# Patient Record
Sex: Female | Born: 1980 | Race: White | Hispanic: No | Marital: Married | Smoking: Current every day smoker
Health system: Southern US, Community
[De-identification: ages and names within clinical notes are randomized; demographics above are authoritative.]

## PROBLEM LIST (undated history)

## (undated) DIAGNOSIS — F32A Depression, unspecified: Secondary | ICD-10-CM

## (undated) DIAGNOSIS — J449 Chronic obstructive pulmonary disease, unspecified: Secondary | ICD-10-CM

## (undated) DIAGNOSIS — I10 Essential (primary) hypertension: Secondary | ICD-10-CM

## (undated) DIAGNOSIS — E079 Disorder of thyroid, unspecified: Secondary | ICD-10-CM

## (undated) DIAGNOSIS — I2510 Atherosclerotic heart disease of native coronary artery without angina pectoris: Secondary | ICD-10-CM

## (undated) DIAGNOSIS — F329 Major depressive disorder, single episode, unspecified: Secondary | ICD-10-CM

## (undated) DIAGNOSIS — K900 Celiac disease: Secondary | ICD-10-CM

## (undated) HISTORY — PX: ABDOMINAL HYSTERECTOMY: SHX81

---

## 2014-12-14 ENCOUNTER — Ambulatory Visit
Admission: EM | Admit: 2014-12-14 | Discharge: 2014-12-14 | Disposition: A | Discharge status: Home / Self Care | Payer: BLUE CROSS/BLUE SHIELD | Attending: Family Medicine | Admitting: Family Medicine

## 2014-12-14 ENCOUNTER — Encounter: Payer: Self-pay | Admitting: *Deleted

## 2014-12-14 ENCOUNTER — Ambulatory Visit: Payer: BLUE CROSS/BLUE SHIELD

## 2014-12-14 DIAGNOSIS — F17210 Nicotine dependence, cigarettes, uncomplicated: Secondary | ICD-10-CM | POA: Diagnosis not present

## 2014-12-14 DIAGNOSIS — S8002XA Contusion of left knee, initial encounter: Secondary | ICD-10-CM | POA: Insufficient documentation

## 2014-12-14 DIAGNOSIS — M25562 Pain in left knee: Secondary | ICD-10-CM | POA: Diagnosis present

## 2014-12-14 DIAGNOSIS — Z79899 Other long term (current) drug therapy: Secondary | ICD-10-CM | POA: Insufficient documentation

## 2014-12-14 DIAGNOSIS — W19XXXA Unspecified fall, initial encounter: Secondary | ICD-10-CM | POA: Insufficient documentation

## 2014-12-14 MED ORDER — KETOROLAC TROMETHAMINE 60 MG/2ML IM SOLN
60.0000 mg | Freq: Once | INTRAMUSCULAR | Status: AC
  Administered 2014-12-14: 60 mg via INTRAMUSCULAR

## 2014-12-14 MED ORDER — MUPIROCIN CALCIUM 2 % EX CREA: TOPICAL_CREAM | Freq: Three times a day (TID) | CUTANEOUS | Status: DC

## 2014-12-14 NOTE — Discharge Instructions (Signed)
Contusion A contusion is a deep bruise. Contusions happen when an injury causes bleeding under the skin. Signs of bruising include pain, puffiness (swelling), and discolored skin. The contusion may turn blue, purple, or yellow. HOME CARE   Put ice on the injured area.  Put ice in a plastic bag.  Place a towel between your skin and the bag.  Leave the ice on for 15-20 minutes, 03-04 times a day.  Only take medicine as told by your doctor.  Rest the injured area.  If possible, raise (elevate) the injured area to lessen puffiness. GET HELP RIGHT AWAY IF:   You have more bruising or puffiness.  You have pain that is getting worse.  Your puffiness or pain is not helped by medicine. MAKE SURE YOU:   Understand these instructions.  Will watch your condition.  Will get help right away if you are not doing well or get worse. Document Released: 10/14/2007 Document Revised: 07/20/2011 Document Reviewed: 03/02/2011 [facility] Patient Information 2015 [place], [place]. This information is not intended to replace advice given to you by your health care provider. Make sure you discuss any questions you have with your health care provider.  Contusion A contusion is a deep bruise. Contusions are the result of an injury that caused bleeding under the skin. The contusion may turn blue, purple, or yellow. Minor injuries will give you a painless contusion, but more severe contusions may stay painful and swollen for a few weeks.  CAUSES  A contusion is usually caused by a blow, trauma, or direct force to an area of the body. SYMPTOMS   Swelling and redness of the injured area.  Bruising of the injured area.  Tenderness and soreness of the injured area.  Pain. DIAGNOSIS  The diagnosis can be made by taking a history and physical exam. An X-ray, CT scan, or MRI may be needed to determine if there were any associated injuries, such as fractures. TREATMENT  Specific treatment will depend on what  area of the body was injured. In general, the best treatment for a contusion is resting, icing, elevating, and applying cold compresses to the injured area. Over-the-counter medicines may also be recommended for pain control. Ask your caregiver what the best treatment is for your contusion. HOME CARE INSTRUCTIONS   Put ice on the injured area.  Put ice in a plastic bag.  Place a towel between your skin and the bag.  Leave the ice on for 15-20 minutes, 3-4 times a day, or as directed by your health care provider.  Only take over-the-counter or prescription medicines for pain, discomfort, or fever as directed by your caregiver. Your caregiver may recommend avoiding anti-inflammatory medicines (aspirin, ibuprofen, and naproxen) for 48 hours because these medicines may increase bruising.  Rest the injured area.  If possible, elevate the injured area to reduce swelling. SEEK IMMEDIATE MEDICAL CARE IF:   You have increased bruising or swelling.  You have pain that is getting worse.  Your swelling or pain is not relieved with medicines. MAKE SURE YOU:   Understand these instructions.  Will watch your condition.  Will get help right away if you are not doing well or get worse. Document Released: 02/04/2005 Document Revised: 05/02/2013 Document Reviewed: 03/02/2011 [facility] Patient Information 2015 [place], [place]. This information is not intended to replace advice given to you by your health care provider. Make sure you discuss any questions you have with your health care provider. Abrasion An abrasion is a cut or scrape of  the skin. Abrasions do not extend through all layers of the skin and most heal within 10 days. It is important to care for your abrasion properly to prevent infection. CAUSES  Most abrasions are caused by falling on, or gliding across, the ground or other surface. When your skin rubs on something, the outer and inner layer of skin rubs off, causing an  abrasion. DIAGNOSIS  Your caregiver will be able to diagnose an abrasion during a physical exam.  TREATMENT  Your treatment depends on how large and deep the abrasion is. Generally, your abrasion will be cleaned with water and a mild soap to remove any dirt or debris. An antibiotic ointment may be put over the abrasion to prevent an infection. A bandage (dressing) may be wrapped around the abrasion to keep it from getting dirty.  You may need a tetanus shot if:  You cannot remember when you had your last tetanus shot.  You have never had a tetanus shot.  The injury broke your skin. If you get a tetanus shot, your arm may swell, get red, and feel warm to the touch. This is common and not a problem. If you need a tetanus shot and you choose not to have one, there is a rare chance of getting tetanus. Sickness from tetanus can be serious.  HOME CARE INSTRUCTIONS   If a dressing was applied, change it at least once a day or as directed by your caregiver. If the bandage sticks, soak it off with warm water.   Wash the area with water and a mild soap to remove all the ointment 2 times a day. Rinse off the soap and pat the area dry with a clean towel.   Reapply any ointment as directed by your caregiver. This will help prevent infection and keep the bandage from sticking. Use gauze over the wound and under the dressing to help keep the bandage from sticking.   Change your dressing right away if it becomes wet or dirty.   Only take over-the-counter or prescription medicines for pain, discomfort, or fever as directed by your caregiver.   Follow up with your caregiver within 24-48 hours for a wound check, or as directed. If you were not given a wound-check appointment, look closely at your abrasion for redness, swelling, or pus. These are signs of infection. SEEK IMMEDIATE MEDICAL CARE IF:   You have increasing pain in the wound.   You have redness, swelling, or tenderness around the wound.    You have pus coming from the wound.   You have a fever or persistent symptoms for more than 2-3 days.  You have a fever and your symptoms suddenly get worse.  You have a bad smell coming from the wound or dressing.  MAKE SURE YOU:   Understand these instructions.  Will watch your condition.  Will get help right away if you are not doing well or get worse. Document Released: 02/04/2005 Document Revised: 04/13/2012 Document Reviewed: 03/31/2011 [facility] Patient Information 2015 [place], [place]. This information is not intended to replace advice given to you by your health care provider. Make sure you discuss any questions you have with your health care provider.

## 2014-12-14 NOTE — ED Notes (Signed)
Pt states "running and fell, landed on rocks and hurt both knees"

## 2014-12-14 NOTE — ED Provider Notes (Addendum)
CSN: [id number]     Arrival date & time 12/14/14  1904 History   First MD Initiated Contact with Patient 12/14/14 1905     Chief Complaint  Patient presents with  . Knee Pain   (Consider location/radiation/quality/duration/timing/severity/associated sxs/prior Treatment) HPI  34 yo F running wide open with child yesterday and fell headlong into rocked area.  Complaining of severe left knee pain and moderate right knee pain Can weight bear- can't tolerated ice pack- mild bruising with superficial abrasion Hasn't taken anything for pain today-has not medicated/dressed abrasions- presents with husband's support  History reviewed. No pertinent past medical history. Past Surgical History  Procedure Laterality Date  . Abdominal hysterectomy     Family History  Problem Relation Age of Onset  . Diabetes Mother   . Congestive Heart Failure Mother   . Diabetes Sister   . Diabetes Brother   . Diabetes Maternal Grandmother    History  Substance Use Topics  . Smoking status: Current Every Day Smoker  . Smokeless tobacco: Not on file  . Alcohol Use: No   OB History    No data available     Review of Systems Constitutional -afebrile Eyes-denies visual changes ENT- normal voice,denies sore throat CV-denies chest pain Resp-denies SOB GI- negative for nausea,vomiting, diarrhea GU- negative for dysuria MSK- negative for back pain, ambulatory; bilateral knees abraded, mimimal swelling on left, none on right, mild ecchymosis Skin- denies acute changes except abrasions bilateral knees Neuro- negative headache,focal weakness or numbness    Allergies  Erythromycin and Penicillins  Home Medications   Prior to Admission medications   Medication Sig Start Date End Date Taking? Authorizing Provider  cyanocobalamin 1000 MCG tablet Take 5,000 mcg by mouth daily.   Yes Historical Provider, MD  mupirocin cream (BACTROBAN) 2 % Apply topically 3 (three) times daily. Please fill with Ointment...  Thank you ! 12/14/14   Rae Halsted, PA-C   BP 107/54 mmHg  Pulse 76  Temp(Src) 98.9 F (37.2 C) (Oral)  Ht 5\' 3"  (1.6 m)  Wt 135 lb (61.236 kg)  BMI 23.92 kg/m2  SpO2 100% Physical Exam   Constitutional -alert and oriented,well appearing, reporting distress knee injuries Head-atraumatic, normocephalic Eyes- conjunctiva normal, EOMI ,conjugate gaze Nose- no congestion or rhinorrhea Mouth/throat- mucous membranes moist , Neck- supple  CV- regular rate, grossly normal heart sounds,  Resp-no distress, normal respiratory effort,clear to auscultation bilaterally GI- ,no distention GU-  not examined MSK- ambulatory without assistance, has mild abrasions of knees left >right; mild ecchymosis, no swelling, normal ROM,complains of pain.Very anxious to have xray eval, husband supportive. No crepitus, no patellar instability, no joint effusion bilaterally Reports unable to do stairs without discomfort Neuro- normal speech and language, no gross focal neurological deficit appreciated, no gait instability, Skin-warm,dry ,intact; no rash noted Psych-mood and affect grossly normal; speech and behavior grossly normal ED Course  Procedures (including critical care time) Labs Review Labs Reviewed - No data to display  Imaging Review Dg Knee Ap/lat W/sunrise Left  12/14/2014   CLINICAL DATA:  Left knee throbbing and pain, acute onset. Initial encounter.  EXAM: LEFT KNEE 3 VIEWS  COMPARISON:  None.  FINDINGS: There is no evidence of fracture or dislocation. The joint spaces are preserved. No significant degenerative change is seen; the patellofemoral joint is grossly unremarkable in appearance.  No significant joint effusion is seen. The visualized soft tissues are normal in appearance.  IMPRESSION: No evidence of fracture or dislocation.   Electronically Signed  By: Roanna Raider M.D.   On: 12/14/2014 20:09     MDM   1. Contusion, knee, left, initial encounter    Plan: 1. x-ray results and  diagnosis reviewed with patient and husband 2. rx as per orders; encourage tylenol/ibuprofen for comfort, add mupirocin and bandaid 3. Recommend supportive treatment with full activity, no restrictions,heat pad may be comfortable when at rest 4. F/u prn if symptoms worsen or don't improve  Medications  ketorolac (TORADOL) injection 60 mg (60 mg Intramuscular Given 12/14/14 1954)  well tolerated-pt pain improved  Rae Halsted, PA-C 12/16/14 0454  Rae Halsted, PA-C 01/25/15 1707

## 2016-04-19 ENCOUNTER — Ambulatory Visit
Admission: EM | Admit: 2016-04-19 | Discharge: 2016-04-19 | Disposition: A | Discharge status: Home / Self Care | Payer: BLUE CROSS/BLUE SHIELD | Attending: Family Medicine | Admitting: Family Medicine

## 2016-04-19 ENCOUNTER — Encounter: Payer: Self-pay | Admitting: Gynecology

## 2016-04-19 DIAGNOSIS — H6501 Acute serous otitis media, right ear: Secondary | ICD-10-CM

## 2016-04-19 DIAGNOSIS — M25552 Pain in left hip: Secondary | ICD-10-CM | POA: Diagnosis not present

## 2016-04-19 DIAGNOSIS — M25551 Pain in right hip: Secondary | ICD-10-CM

## 2016-04-19 HISTORY — DX: Disorder of thyroid, unspecified: E07.9

## 2016-04-19 HISTORY — DX: Essential (primary) hypertension: I10

## 2016-04-19 HISTORY — DX: Atherosclerotic heart disease of native coronary artery without angina pectoris: I25.10

## 2016-04-19 HISTORY — DX: Major depressive disorder, single episode, unspecified: F32.9

## 2016-04-19 HISTORY — DX: Chronic obstructive pulmonary disease, unspecified: J44.9

## 2016-04-19 HISTORY — DX: Depression, unspecified: F32.A

## 2016-04-19 HISTORY — DX: Celiac disease: K90.0

## 2016-04-19 MED ORDER — SULFAMETHOXAZOLE-TRIMETHOPRIM 800-160 MG PO TABS: 1.0000 | ORAL_TABLET | Freq: Two times a day (BID) | ORAL | 0 refills | Status: AC

## 2016-04-19 MED ORDER — NAPROXEN 500 MG PO TABS: 500.0000 mg | ORAL_TABLET | Freq: Two times a day (BID) | ORAL | 0 refills | Status: DC

## 2016-04-19 NOTE — Discharge Instructions (Signed)
You will need to follow up with your pcp about the hip pain may need to be seen by ortho

## 2016-04-19 NOTE — ED Triage Notes (Signed)
Per patient bilateral hip pain. Patient stated was seen by her doctor on 21/1/17 and given prednisone which is not helping. Patient also c/o right ear pain x yesterday.

## 2016-04-19 NOTE — ED Provider Notes (Signed)
CSN: [id number]     Arrival date & time 04/19/16  1300 History   First MD Initiated Contact with Patient 04/19/16 1415     Chief Complaint  Patient presents with  . Hip Pain  . Otalgia   (Consider location/radiation/quality/duration/timing/severity/associated sxs/prior Treatment) Pt states that she has had bil hip pain with walking since thanksgiving . Seen her pcp and was given prednisone po. Not any better denies any injury. Able to walk and bend. Denies any back problems in the past. C/o rt ear pain slightly for the past few days. Has not taken anything for this.       Past Medical History:  Diagnosis Date  . Celiac disease   . COPD (chronic obstructive pulmonary disease) (HCC)   . Coronary artery disease   . Depression   . Hypertension   . Thyroid disease    Past Surgical History:  Procedure Laterality Date  . ABDOMINAL HYSTERECTOMY     Family History  Problem Relation Age of Onset  . Diabetes Mother   . Congestive Heart Failure Mother   . Diabetes Sister   . Diabetes Brother   . Diabetes Maternal Grandmother    Social History  Substance Use Topics  . Smoking status: Current Every Day Smoker    Packs/day: 1.00    Types: Cigarettes  . Smokeless tobacco: Never Used  . Alcohol use No   OB History    No data available     Review of Systems  Constitutional: Negative.   HENT: Positive for ear pain.        Rt   Eyes: Negative.   Respiratory: Negative.   Cardiovascular: Negative.   Musculoskeletal:       Bil hip pain   Neurological: Negative.     Allergies  Erythromycin; Gluten meal; and Penicillins  Home Medications   Prior to Admission medications   Medication Sig Start Date End Date Taking? Authorizing Provider  cyanocobalamin 1000 MCG tablet Take 5,000 mcg by mouth daily.    Historical Provider, MD  mupirocin cream (BACTROBAN) 2 % Apply topically 3 (three) times daily. Please fill with Ointment... Thank you ! 12/14/14   Rae HalstedLaurie W Lee, PA-C  naproxen  (NAPROSYN) 500 MG tablet Take 1 tablet (500 mg total) by mouth 2 (two) times daily. 04/19/16   Tobi BastosMelanie A Linville Decarolis, NP  sulfamethoxazole-trimethoprim (BACTRIM DS,SEPTRA DS) 800-160 MG tablet Take 1 tablet by mouth 2 (two) times daily. 04/19/16 04/26/16  Tobi BastosMelanie A Kinser Fellman, NP   Meds Ordered and Administered this Visit  Medications - No data to display  BP 100/61 (BP Location: Left Arm)   Pulse 71   Temp 98.9 F (37.2 C) (Oral)   Resp 16   Wt 132 lb (59.9 kg)   SpO2 100%   BMI 23.38 kg/m  No data found.   Physical Exam  Constitutional: She appears well-developed.  HENT:  Head: Normocephalic.  Rt ear erythema bulging.   Eyes: Pupils are equal, round, and reactive to light.  Neck: Normal range of motion.  Cardiovascular: Normal rate and regular rhythm.   Pulmonary/Chest: Effort normal and breath sounds normal.  Musculoskeletal: She exhibits tenderness.  Able to flex and bend no loss of bowels   Skin: Skin is warm. Capillary refill takes less than 2 seconds.    Urgent Care Course   Clinical Course     Procedures (including critical care time)  Labs Review Labs Reviewed - No data to display  Imaging Review No results found.  V       MDM   1. Pain of both hip joints   2. Right acute serous otitis media, recurrence not specified    May need to see pcp for referral to ortho for hip pain Take NSAIDS as needed for pain You have a rt side ear infection take full dose of abx     Tobi BastosMelanie A Sueellen Kayes, NP 04/19/16 1450

## 2016-05-02 IMAGING — CR DG KNEE AP/LAT W/ SUNRISE*L*
4 series · 4 of 4 positions shown · non-contrast
Comparison: None.

CLINICAL DATA: Left knee throbbing and pain, acute onset. Initial
encounter.

EXAM:
LEFT KNEE 3 VIEWS

[knee ap (1 of 3)]
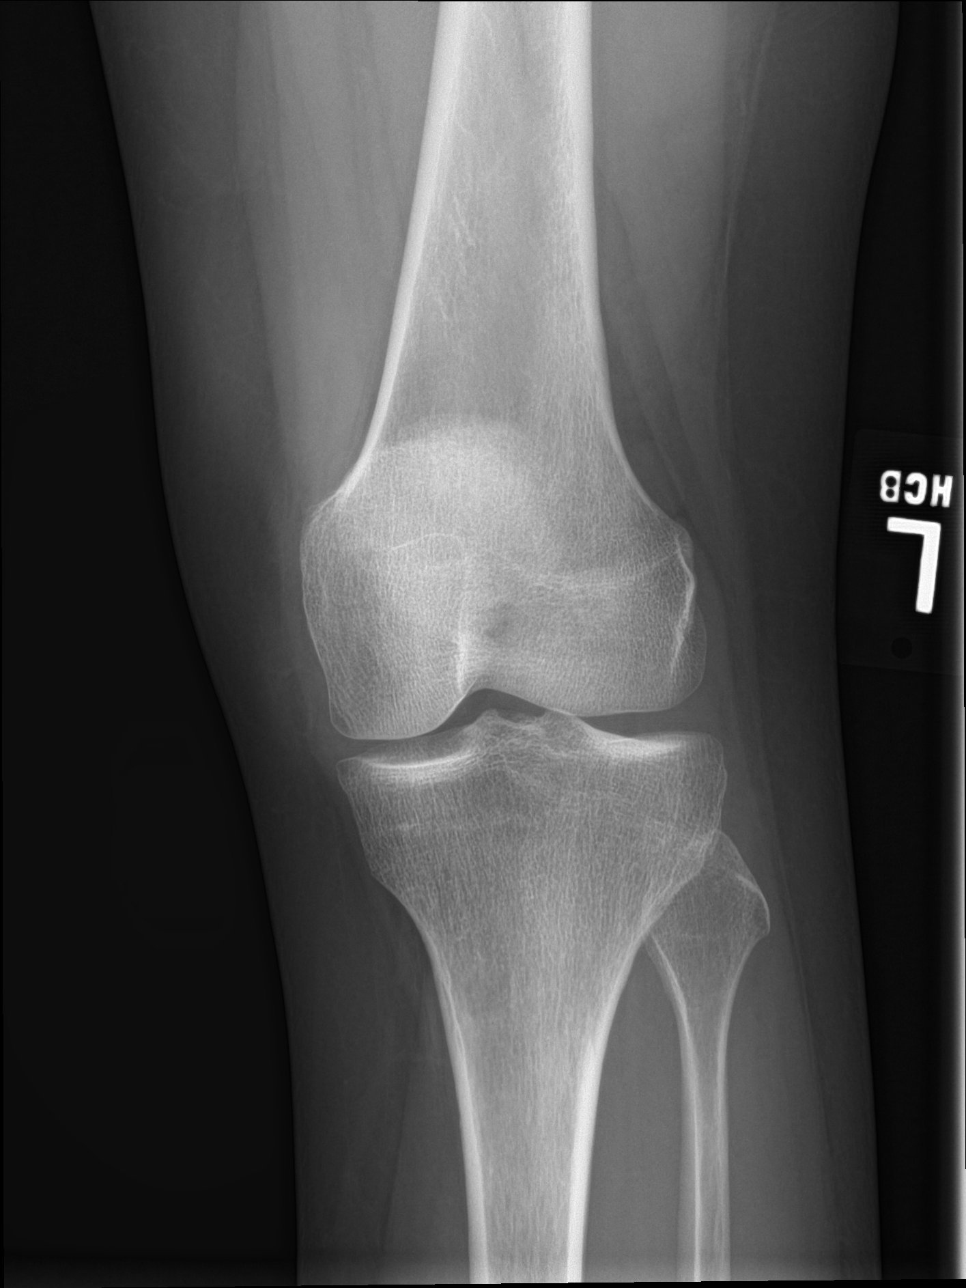

[knee lat]
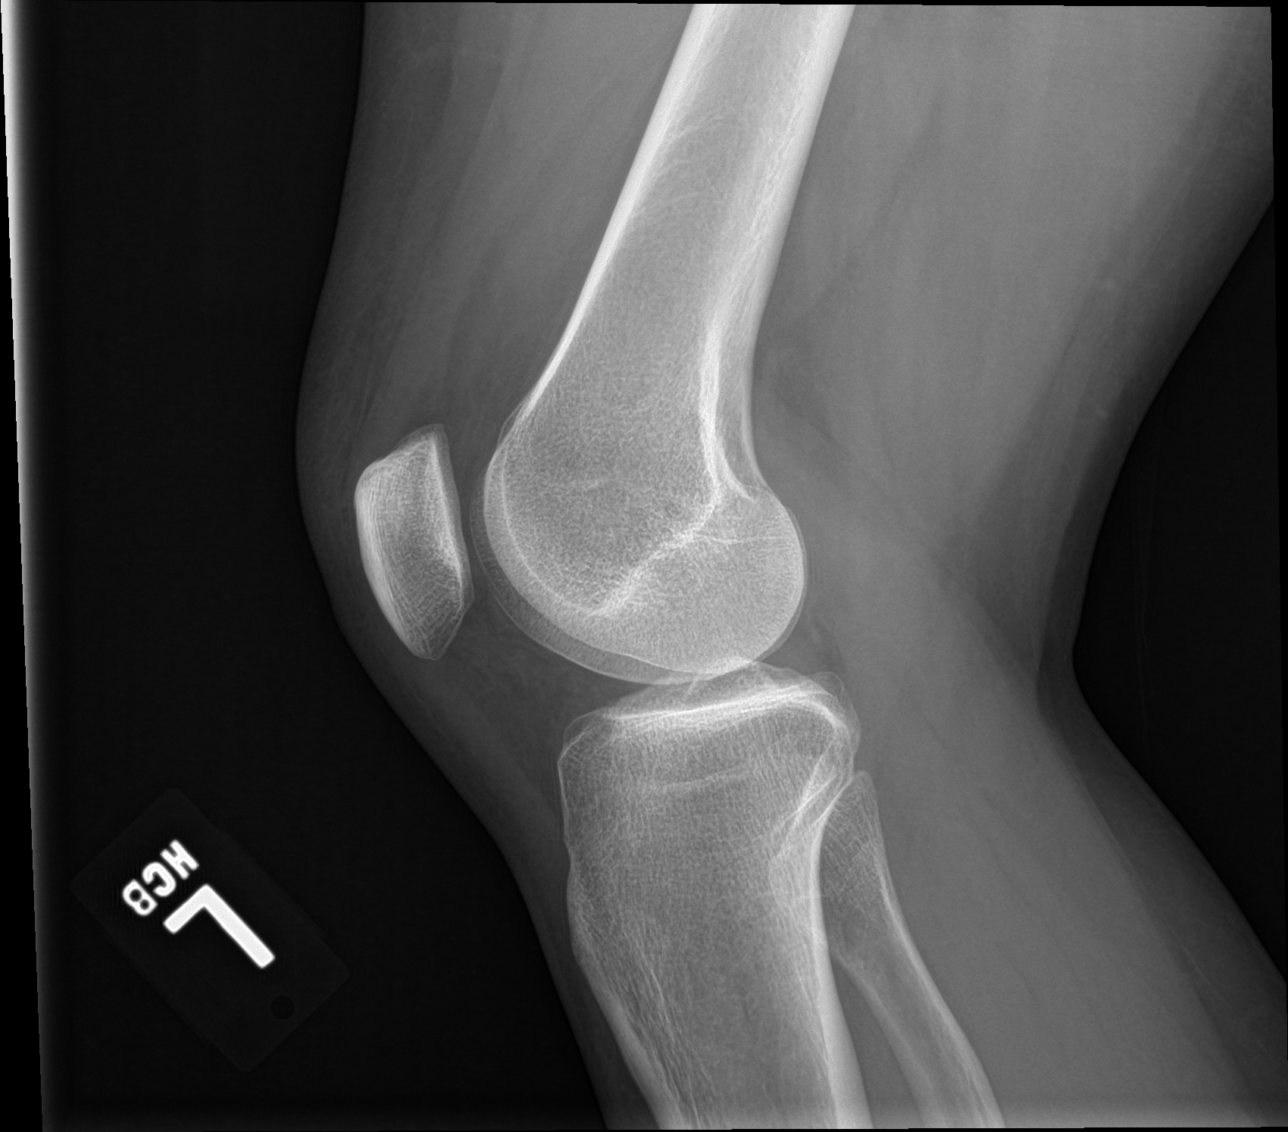

[knee ap (2 of 3)]
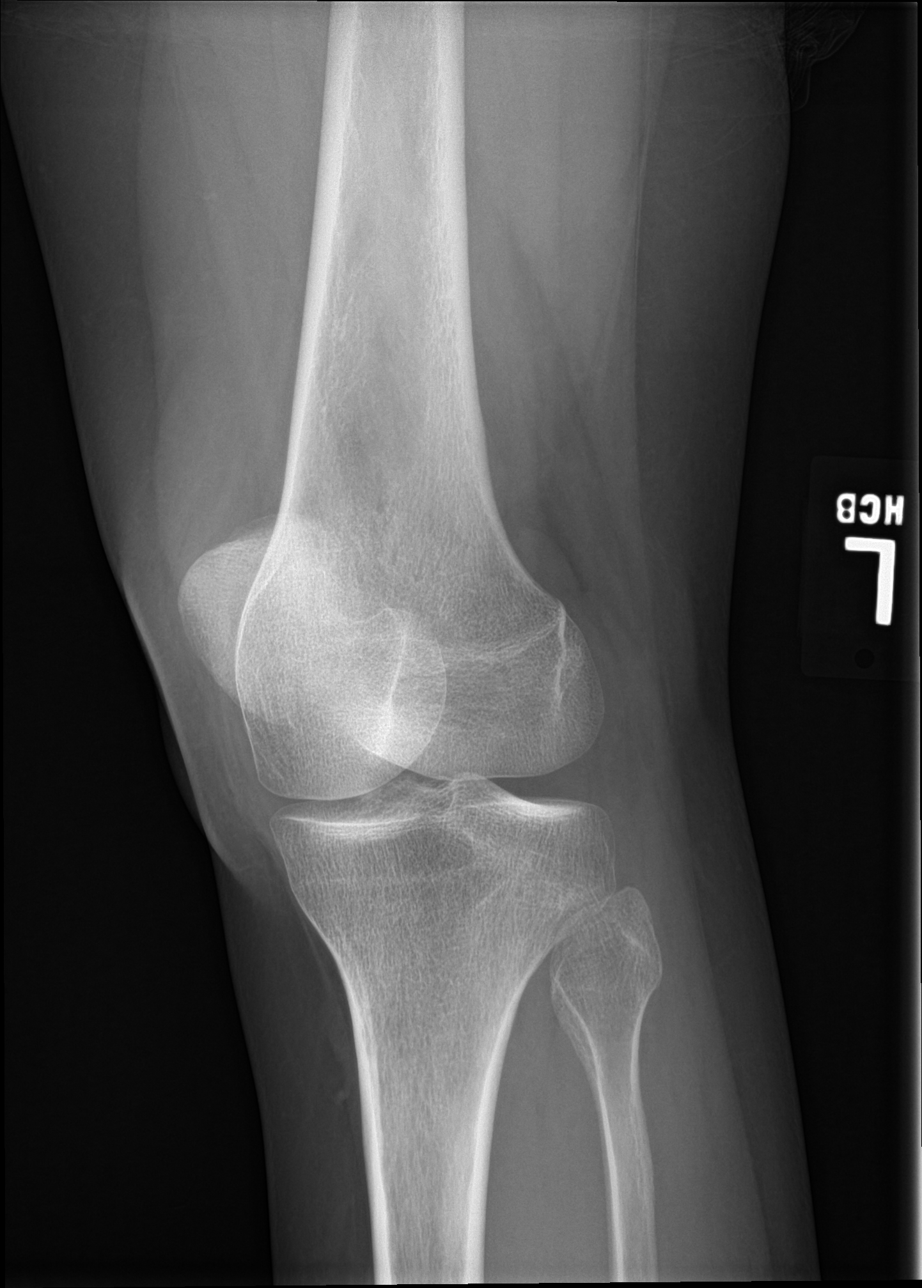

[knee ap (3 of 3)]
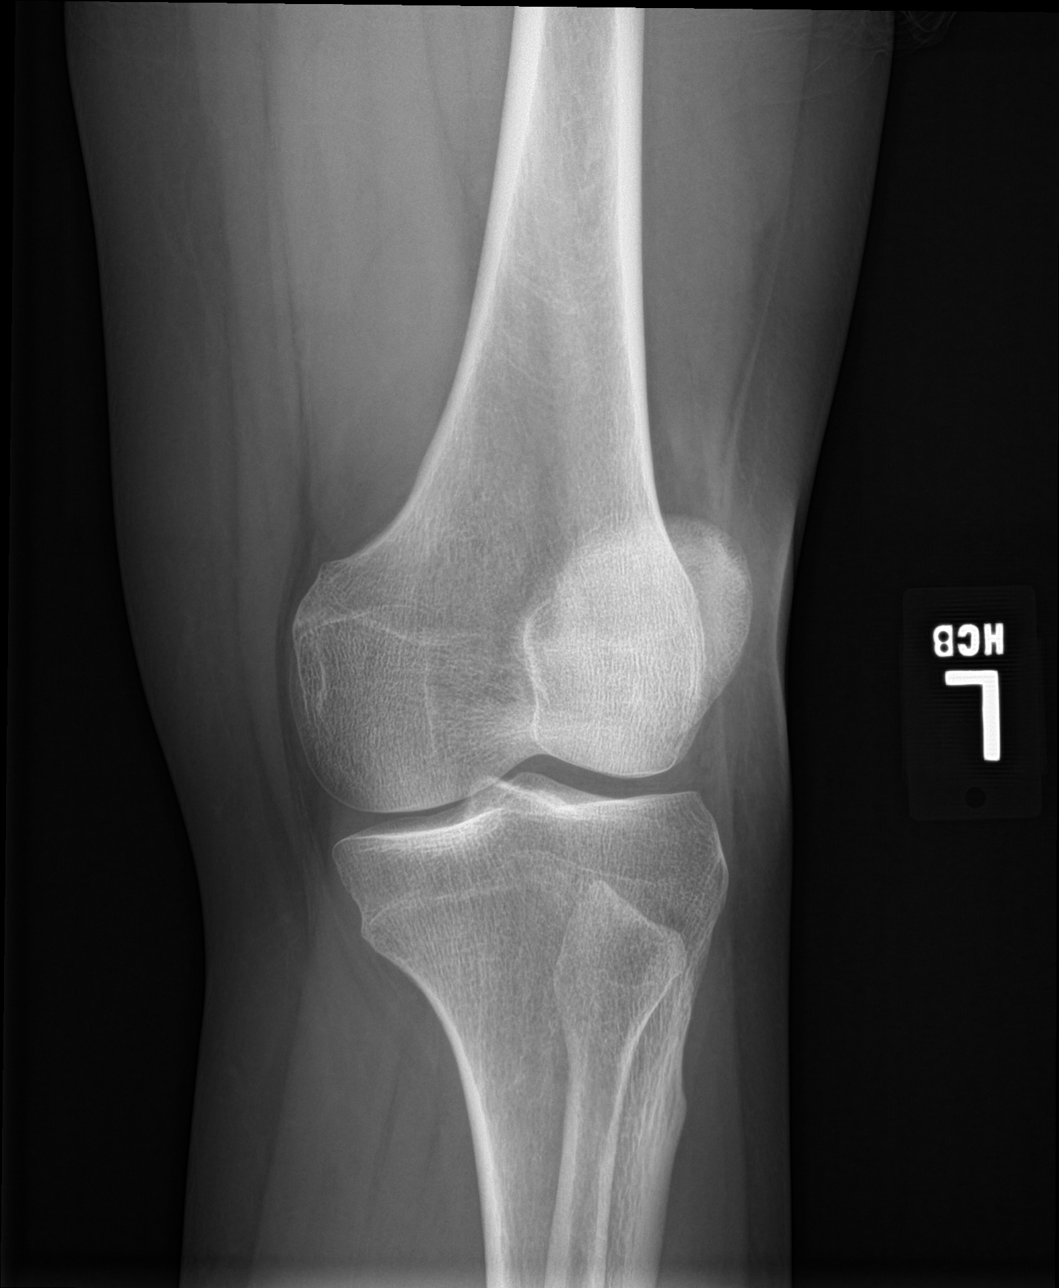

[4 of 4 positions shown; findings below may reference images not displayed]

FINDINGS: There is no evidence of fracture or dislocation. The joint spaces
are preserved. No significant degenerative change is seen; the
patellofemoral joint is grossly unremarkable in appearance.

No significant joint effusion is seen. The visualized soft tissues
are normal in appearance.
IMPRESSION: No evidence of fracture or dislocation.

## 2020-02-25 ENCOUNTER — Ambulatory Visit
Admission: EM | Admit: 2020-02-25 | Discharge: 2020-02-25 | Disposition: A | Discharge status: Home / Self Care | Payer: Commercial Managed Care - PPO | Attending: Orthopedic Surgery | Admitting: Orthopedic Surgery

## 2020-02-25 ENCOUNTER — Other Ambulatory Visit: Payer: Self-pay

## 2020-02-25 ENCOUNTER — Encounter: Payer: Self-pay | Admitting: Emergency Medicine

## 2020-02-25 DIAGNOSIS — M75102 Unspecified rotator cuff tear or rupture of left shoulder, not specified as traumatic: Secondary | ICD-10-CM | POA: Diagnosis not present

## 2020-02-25 MED ORDER — MELOXICAM 15 MG PO TABS: 15.0000 mg | ORAL_TABLET | Freq: Every day | ORAL | 0 refills | Status: DC

## 2020-02-25 MED ORDER — PREDNISONE 10 MG PO TABS: 10.0000 mg | ORAL_TABLET | Freq: Every day | ORAL | 0 refills | Status: DC

## 2020-02-25 NOTE — Discharge Instructions (Addendum)
Please take prednisone as prescribed for 6 days then start meloxicam.  Do not take ibuprofen or naproxen while on these 2 medications.  You may take Tylenol for additional pain relief.  If no improvement in 3 weeks, follow-up with orthopedics.

## 2020-02-25 NOTE — ED Provider Notes (Signed)
[facility]    CSN: [id number] Arrival date & time: 02/25/20  0934      History   Chief Complaint Chief Complaint  Patient presents with  . Shoulder Pain    left    HPI Sheryl Marshall is a 39 y.o. female presents to the emergency department for evaluation of left shoulder pain.  Pain is been present for 2 months.  Patient states she was doing some bench press couple months ago and felt some pain along the left posterior shoulder.  Pain is been intermittent, recently getting worse at nighttime and awakening her.  No numbness tingling radicular symptoms.  She is been performing activity modifications at the gym and avoiding any overhead exercises.  She has been taken occasional ibuprofen which helps some.  She denies any weakness. HPI  Past Medical History:  Diagnosis Date  . Celiac disease   . COPD (chronic obstructive pulmonary disease) (HCC)   . Coronary artery disease   . Depression   . Hypertension   . Thyroid disease     There are no problems to display for this patient.   Past Surgical History:  Procedure Laterality Date  . ABDOMINAL HYSTERECTOMY      OB History   No obstetric history on file.      Home Medications    Prior to Admission medications   Medication Sig Start Date End Date Taking? Authorizing Provider  cyanocobalamin 1000 MCG tablet Take 5,000 mcg by mouth daily.    [provider]  meloxicam (MOBIC) 15 MG tablet Take 1 tablet (15 mg total) by mouth daily. 02/25/20   Evon Slack, PA-C  mupirocin cream (BACTROBAN) 2 % Apply topically 3 (three) times daily. Please fill with Ointment... Thank you ! 12/14/14   Rae Halsted, PA-C  predniSONE (DELTASONE) 10 MG tablet Take 1 tablet (10 mg total) by mouth daily. 6,5,4,3,2,1 six day taper 02/25/20   Evon Slack, PA-C    Family History Family History  Problem Relation Age of Onset  . Diabetes Mother   . Congestive Heart Failure Mother   . Diabetes Sister   .  Diabetes Brother   . Diabetes Maternal Grandmother     Social History Social History   Tobacco Use  . Smoking status: Current Every Day Smoker    Packs/day: 1.00    Types: Cigarettes  . Smokeless tobacco: Never Used  Vaping Use  . Vaping Use: Never used  Substance Use Topics  . Alcohol use: No  . Drug use: Not on file     Allergies   Erythromycin, Gluten meal, and Penicillins   Review of Systems Review of Systems  Constitutional: Negative for fever.  Cardiovascular: Negative for chest pain.  Musculoskeletal: Positive for myalgias. Negative for arthralgias, joint swelling and neck pain.  Skin: Negative for rash.  Neurological: Negative for numbness.     Physical Exam Triage Vital Signs ED Triage Vitals  Enc Vitals Group     BP 02/25/20 0950 109/68     Pulse Rate 02/25/20 0950 99     Resp 02/25/20 0950 14     Temp 02/25/20 0950 98.3 F (36.8 C)     Temp Source 02/25/20 0950 Oral     SpO2 02/25/20 0950 98 %     Weight 02/25/20 0947 132 lb (59.9 kg)     Height 02/25/20 0947 5\' 3"  (1.6 m)     Head Circumference --      Peak Flow --  Pain Score 02/25/20 0947 1     Pain Loc --      Pain Edu? --      Excl. in GC? --    No data found.  Updated Vital Signs BP 109/68 (BP Location: Right Arm)   Pulse 99   Temp 98.3 F (36.8 C) (Oral)   Resp 14   Ht 5\' 3"  (1.6 m)   Wt 132 lb (59.9 kg)   SpO2 98%   BMI 23.38 kg/m   Visual Acuity Right Eye Distance:   Left Eye Distance:   Bilateral Distance:    Right Eye Near:   Left Eye Near:    Bilateral Near:     Physical Exam Constitutional:      Appearance: She is well-developed.  HENT:     Head: Normocephalic and atraumatic.  Eyes:     Conjunctiva/sclera: Conjunctivae normal.  Cardiovascular:     Rate and Rhythm: Normal rate.  Pulmonary:     Effort: Pulmonary effort is normal. No respiratory distress.  Musculoskeletal:        General: Normal range of motion.     Cervical back: Normal range of  motion.     Comments: ,Left shoulder with full active range of motion and no tenderness to palpation.  Positive Hawkins and impingement test.  5 out of 5 strength with resisted abduction, flexion, internal and external rotation.  Normal internal and external rotation range of motion.  Neuro vas intact in left upper extremity.  Skin:    General: Skin is warm.     Findings: No rash.  Neurological:     Mental Status: She is alert and oriented to person, place, and time.  Psychiatric:        Behavior: Behavior normal.        Thought Content: Thought content normal.      UC Treatments / Results  Labs (all labs ordered are listed, but only abnormal results are displayed) Labs Reviewed - No data to display  EKG   Radiology No results found.  Procedures Procedures (including critical care time)  Medications Ordered in UC Medications - No data to display  Initial Impression / Assessment and Plan / UC Course  I have reviewed the triage vital signs and the nursing notes.  Pertinent labs & imaging results that were available during my care of the patient were reviewed by me and considered in my medical decision making (see chart for details).     39 year old female with 2 months of left-sided rotator cuff syndrome.  Intermittently taken ibuprofen.  We will have her take prednisone taper for 6 days, DC all other NSAIDs.  After 6 days she will start meloxicam daily and will follow-up with orthopedics if no improvement after 2 weeks.  Patient understands signs symptoms return to clinic for. Final Clinical Impressions(s) / UC Diagnoses   Final diagnoses:  Rotator cuff syndrome of left shoulder     Discharge Instructions     Please take prednisone as prescribed for 6 days then start meloxicam.  Do not take ibuprofen or naproxen while on these 2 medications.  You may take Tylenol for additional pain relief.  If no improvement in 3 weeks, follow-up with orthopedics.   ED Prescriptions     Medication Sig Dispense Auth. Provider   predniSONE (DELTASONE) 10 MG tablet Take 1 tablet (10 mg total) by mouth daily. 6,5,4,3,2,1 six day taper 21 tablet 24, PA-C   meloxicam (MOBIC) 15 MG tablet Take 1  tablet (15 mg total) by mouth daily. 30 tablet Evon Slack, PA-C     PDMP not reviewed this encounter.   Evon Slack, [place] 02/25/20 [phone]

## 2020-02-25 NOTE — ED Triage Notes (Signed)
Patient states that she has been doing bench presses and started having pain in her her left shoulder about 2 weeks ago.  Patient reports limited range of motion and increase pain when she raises and moves her left arm back.

## 2021-04-21 ENCOUNTER — Ambulatory Visit
Admission: EM | Admit: 2021-04-21 | Discharge: 2021-04-21 | Disposition: A | Discharge status: Home / Self Care | Payer: Commercial Managed Care - PPO | Attending: Family | Admitting: Family

## 2021-04-21 ENCOUNTER — Other Ambulatory Visit: Payer: Self-pay

## 2021-04-21 DIAGNOSIS — M546 Pain in thoracic spine: Secondary | ICD-10-CM | POA: Diagnosis not present

## 2021-04-21 DIAGNOSIS — B029 Zoster without complications: Secondary | ICD-10-CM | POA: Diagnosis not present

## 2021-04-21 DIAGNOSIS — R350 Frequency of micturition: Secondary | ICD-10-CM | POA: Diagnosis not present

## 2021-04-21 LAB — URINALYSIS, COMPLETE (UACMP) WITH MICROSCOPIC
Bilirubin Urine: NEGATIVE
Glucose, UA: NEGATIVE mg/dL
Hgb urine dipstick: NEGATIVE
Leukocytes,Ua: NEGATIVE
Nitrite: NEGATIVE
Protein, ur: NEGATIVE mg/dL
Specific Gravity, Urine: 1.025 (ref 1.005–1.030)
pH: 6.5 (ref 5.0–8.0)

## 2021-04-21 MED ORDER — OXYCODONE-ACETAMINOPHEN 5-325 MG PO TABS: 1.0000 | ORAL_TABLET | Freq: Four times a day (QID) | ORAL | 0 refills | Status: DC | PRN

## 2021-04-21 MED ORDER — VALACYCLOVIR HCL 1 G PO TABS
1000.0000 mg | ORAL_TABLET | Freq: Three times a day (TID) | ORAL | 0 refills | Status: AC
Start: 2021-04-21 — End: 2021-04-28

## 2021-04-21 NOTE — ED Triage Notes (Addendum)
Patient presents to Urgent Care with multiple complaints of back pain since Thursday. Treating pain with muscle relaxer. She states yesterday she noted a rash located on the same area where the pain is at. She also complains of urinary freq. She reports odor to her urine that since has resolved.   Denies fever, hematuria.

## 2021-04-21 NOTE — Discharge Instructions (Addendum)
Recommend start Valtrex 1 tablet 3 times a day as directed. May continue OTC Advil 800mg  every 8 hours as needed for pain. For severe pain and use mainly at night, may take Percocet 1 tablet every 6 hours as needed. May apply Triple antibiotic ointment to rash area on left abdomen or topical Lidocaine ointment as needed for comfort. Continue to monitor symptoms. If rash or pain gets worse, return for recheck. Follow-up pending urine culture results.

## 2021-04-21 NOTE — ED Provider Notes (Signed)
[facility]    CSN: [id number] Arrival date & time: 04/21/21  1503      History   Chief Complaint Chief Complaint  Patient presents with   Back Pain   Rash   Urinary Frequency    HPI Sheryl Marshall is a 40 y.o. female.   40 year old female accompanied by her husband with concern over left sided back pain that started 5 days ago. Initially started with left upper abdominal/below rib cage itching. Then started having pain in the left thoracic and upper lumbar area along with pain near frontal area of itching. Thought she may have injured her back and started taking a muscle relaxer and Mobic with no relief. Pain has been constant and yesterday developed a rash on the left abdominal area where she was previously itching. Also has noticed increased urinary frequency but denies any dysuria, hematuria or unusual vaginal discharge. No fever. Has taken Advil with minimal relief. Was crying earlier due to pain. Has been under increased stress with recent travel to [facility] for a funeral. Had Chicken Pox when she was a child. No current chronic health issues. Takes no daily medication.   The history is provided by the patient.   Past Medical History:  Diagnosis Date   Celiac disease    COPD (chronic obstructive pulmonary disease) (HCC)    Coronary artery disease    Depression    Hypertension    Thyroid disease     There are no problems to display for this patient.   Past Surgical History:  Procedure Laterality Date   ABDOMINAL HYSTERECTOMY      OB History   No obstetric history on file.      Home Medications    Prior to Admission medications   Medication Sig Start Date End Date Taking? Authorizing Provider  oxyCODONE-acetaminophen (PERCOCET/ROXICET) 5-325 MG tablet Take 1 tablet by mouth every 6 (six) hours as needed for severe pain. 04/21/21  Yes Kennedy Brines, Ali Lowe, NP  valACYclovir (VALTREX) 1000 MG tablet Take 1 tablet (1,000 mg total) by mouth 3 (three)  times daily for 7 days. 04/21/21 04/28/21 Yes Litisha Guagliardo, Ali Lowe, NP  cyanocobalamin 1000 MCG tablet Take 5,000 mcg by mouth daily.    [provider]  meloxicam (MOBIC) 15 MG tablet Take 1 tablet (15 mg total) by mouth daily. 02/25/20   Evon Slack, PA-C    Family History Family History  Problem Relation Age of Onset   Diabetes Mother    Congestive Heart Failure Mother    Diabetes Sister    Diabetes Brother    Diabetes Maternal Grandmother     Social History Social History   Tobacco Use   Smoking status: Every Day    Packs/day: 1.00    Types: Cigarettes   Smokeless tobacco: Never  Vaping Use   Vaping Use: Never used  Substance Use Topics   Alcohol use: No   Drug use: Never     Allergies   Erythromycin, Gluten meal, and Penicillins   Review of Systems Review of Systems  Constitutional:  Negative for appetite change, chills, fatigue and fever.  HENT:  Negative for facial swelling, mouth sores, sore throat and trouble swallowing.   Respiratory:  Negative for chest tightness and shortness of breath.   Gastrointestinal:  Negative for nausea and vomiting.  Genitourinary:  Positive for flank pain and frequency. Negative for difficulty urinating, dysuria, hematuria, urgency and vaginal discharge.  Musculoskeletal:  Positive for back pain. Negative for  neck pain.  Skin:  Positive for rash. Negative for wound.  Allergic/Immunologic: Positive for food allergies. Negative for environmental allergies and immunocompromised state.  Neurological:  Negative for dizziness, tremors, seizures, syncope, weakness, light-headedness and numbness.  Hematological:  Negative for adenopathy. Does not bruise/bleed easily.    Physical Exam Triage Vital Signs ED Triage Vitals  Enc Vitals Group     BP 04/21/21 1641 92/64     Pulse Rate 04/21/21 1641 85     Resp 04/21/21 1641 16     Temp 04/21/21 1641 98.8 F (37.1 C)     Temp Source 04/21/21 1641 Oral     SpO2 04/21/21 1641 96  %     Weight --      Height --      Head Circumference --      Peak Flow --      Pain Score 04/21/21 1638 3     Pain Loc --      Pain Edu? --      Excl. in [place]? --    No data found.  Updated Vital Signs BP 92/64 (BP Location: Left Arm)   Pulse 85   Temp 98.8 F (37.1 C) (Oral)   Resp 16   SpO2 96%   Visual Acuity Right Eye Distance:   Left Eye Distance:   Bilateral Distance:    Right Eye Near:   Left Eye Near:    Bilateral Near:     Physical Exam Vitals and nursing note reviewed.  Constitutional:      General: She is awake. She is not in acute distress.    Appearance: She is well-developed and well-groomed.     Comments: She is sitting on the exam table in no acute distress but appears uncomfortable due to pain.   HENT:     Head: Normocephalic and atraumatic.     Right Ear: Hearing normal.     Left Ear: Hearing normal.  Eyes:     Extraocular Movements: Extraocular movements intact.     Conjunctiva/sclera: Conjunctivae normal.  Cardiovascular:     Rate and Rhythm: Normal rate and regular rhythm.     Heart sounds: Normal heart sounds. No murmur heard. Pulmonary:     Effort: Pulmonary effort is normal. No respiratory distress.     Breath sounds: Normal breath sounds and air entry.       Comments: A few pink papular lesions present along left lower thoracic to upper lumbar area and tender. No crusting or discharge.  Chest:     Chest wall: Tenderness present. No mass.       Comments:  Papular lesion rash present along left lower rib cage with slightly erythematous base. No distinct vesicles yet. No crusting or discharge. Tender to palpation.  Abdominal:     Palpations: Abdomen is soft.     Tenderness: There is no right CVA tenderness or left CVA tenderness.  Musculoskeletal:        General: Tenderness present. Normal range of motion.     Cervical back: Normal, normal range of motion and neck supple.     Thoracic back: Tenderness present. No swelling, edema or  spasms. Normal range of motion.     Lumbar back: Tenderness present. No swelling, edema or spasms. Normal range of motion.  Skin:    General: Skin is warm and dry.     Capillary Refill: Capillary refill takes less than 2 seconds.     Findings: Erythema and rash present. No abscess, bruising,  ecchymosis or petechiae. Rash is papular. Rash is not crusting, pustular or scaling.  Neurological:     General: No focal deficit present.     Mental Status: She is alert and oriented to person, place, and time.  Psychiatric:        Mood and Affect: Mood normal.        Behavior: Behavior normal. Behavior is cooperative.        Thought Content: Thought content normal.        Judgment: Judgment normal.     UC Treatments / Results  Labs (all labs ordered are listed, but only abnormal results are displayed) Labs Reviewed  URINALYSIS, COMPLETE (UACMP) WITH MICROSCOPIC - Abnormal; Notable for the following components:      Result Value   Ketones, ur TRACE (*)    Bacteria, UA FEW (*)    All other components within normal limits  URINE CULTURE    EKG   Radiology No results found.  Procedures Procedures (including critical care time)  Medications Ordered in UC Medications - No data to display  Initial Impression / Assessment and Plan / UC Course  I have reviewed the triage vital signs and the nursing notes.  Pertinent labs & imaging results that were available during my care of the patient were reviewed by me and considered in my medical decision making (see chart for details).     Reviewed urinalysis results with patient- slight ketones and slight bacteria present- probable skin contamination since negative WBC's and nitrites. Doubt UTI but will send urine for culture for confirmation.  Reviewed with patient and husband that she appears to have Shingles. Recommend start Valtrex 1g 3 times a day for 7 days. May continue OTC Advil 800mg  every 8 hours as needed for pain. For severe pain and  use mainly at night, may take Percocet 1 tablet every 6 hours as needed. May apply Triple antibiotic ointment to rash if lesions open and any drainage occurs. May also apply topical OTC Lidocaine gel to rash area as needed for comfort. Continue to monitor symptoms. If rash or pain gets worse, return for recheck. Otherwise, follow-up pending urine culture results.  Final Clinical Impressions(s) / UC Diagnoses   Final diagnoses:  Herpes zoster without complication  Acute left-sided thoracic back pain  Urinary frequency     Discharge Instructions      Recommend start Valtrex 1 tablet 3 times a day as directed. May continue OTC Advil 800mg  every 8 hours as needed for pain. For severe pain and use mainly at night, may take Percocet 1 tablet every 6 hours as needed. May apply Triple antibiotic ointment to rash area on left abdomen or topical Lidocaine ointment as needed for comfort. Continue to monitor symptoms. If rash or pain gets worse, return for recheck. Follow-up pending urine culture results.     ED Prescriptions     Medication Sig Dispense Auth. Provider   valACYclovir (VALTREX) 1000 MG tablet Take 1 tablet (1,000 mg total) by mouth 3 (three) times daily for 7 days. 21 tablet Isaak Delmundo, Nicholes Stairs, NP   oxyCODONE-acetaminophen (PERCOCET/ROXICET) 5-325 MG tablet Take 1 tablet by mouth every 6 (six) hours as needed for severe pain. 10 tablet Vastie Douty, Nicholes Stairs, NP      I have reviewed the PDMP during this encounter. No active Rx for controlled medication. I believe the benefits outweigh the risks for a controlled pain medication at this time.    Katy Apo, NP 04/22/21 [phone]

## 2021-04-22 LAB — URINE CULTURE: Culture: <10000 — AB

## 2021-11-13 ENCOUNTER — Ambulatory Visit (INDEPENDENT_AMBULATORY_CARE_PROVIDER_SITE_OTHER): Payer: Commercial Managed Care - PPO

## 2021-11-13 ENCOUNTER — Ambulatory Visit
Admission: EM | Admit: 2021-11-13 | Discharge: 2021-11-13 | Disposition: A | Discharge status: Home / Self Care | Payer: Commercial Managed Care - PPO | Attending: Emergency Medicine | Admitting: Emergency Medicine

## 2021-11-13 ENCOUNTER — Encounter: Payer: Self-pay | Admitting: Emergency Medicine

## 2021-11-13 ENCOUNTER — Other Ambulatory Visit: Payer: Self-pay

## 2021-11-13 DIAGNOSIS — R059 Cough, unspecified: Secondary | ICD-10-CM

## 2021-11-13 DIAGNOSIS — R0602 Shortness of breath: Secondary | ICD-10-CM | POA: Diagnosis not present

## 2021-11-13 DIAGNOSIS — J988 Other specified respiratory disorders: Secondary | ICD-10-CM

## 2021-11-13 DIAGNOSIS — B9789 Other viral agents as the cause of diseases classified elsewhere: Secondary | ICD-10-CM

## 2021-11-13 MED ORDER — PREDNISONE 20 MG PO TABS: 60.0000 mg | ORAL_TABLET | Freq: Every day | ORAL | 0 refills | Status: AC

## 2021-11-13 MED ORDER — PROMETHAZINE-DM 6.25-15 MG/5ML PO SYRP: 5.0000 mL | ORAL_SOLUTION | Freq: Four times a day (QID) | ORAL | 0 refills | Status: DC | PRN

## 2021-11-13 MED ORDER — AEROCHAMBER MV MISC: 2 refills | Status: DC

## 2021-11-13 MED ORDER — ALBUTEROL SULFATE HFA 108 (90 BASE) MCG/ACT IN AERS: 2.0000 | INHALATION_SPRAY | RESPIRATORY_TRACT | 0 refills | Status: DC | PRN

## 2021-11-13 MED ORDER — BENZONATATE 100 MG PO CAPS: 200.0000 mg | ORAL_CAPSULE | Freq: Three times a day (TID) | ORAL | 0 refills | Status: DC

## 2021-11-13 NOTE — Discharge Instructions (Addendum)
Your chest x-ray did not demonstrate the presence of either bronchitis or pneumonia.  I do believe you have a viral respiratory infection.  Use the albuterol inhaler with a spacer, 2 puffs every 4-6 hours, as needed for shortness of breath and cough.  In the morning start the prednisone.  You will take 60 mg each morning at breakfast time for 5 days to help with pulmonary inflammation.  Use the Tessalon Perles every 8 hours on the days needed for cough.  Take them with a small sip of water.  They may give you some numbness to the base of your tongue or metallic taste in mouth, this is normal.  Use the Promethazine DM cough syrup at bedtime as needed for control of cough symptoms and sleep.  This medicine will make you drowsy.  Please return for reevaluation for any new or worsening symptoms.

## 2021-11-13 NOTE — ED Provider Notes (Signed)
[facility]    CSN: [id number] Arrival date & time: 11/13/21  1801      History   Chief Complaint Chief Complaint  Patient presents with   Cough   Shortness of Breath    HPI Sheryl Marshall is a 41 y.o. female.   HPI  41 year old female here for evaluation of respiratory complaints.  Patient reports that for last 6 days she has been experiencing chest tightness, shortness breath, and a cough.  She states that the cough was initially productive but that has tapered off over the last few days.  This has not been associated with fever, runny nose, nasal congestion, ear pain, sore throat, or wheezing.  Patient is an everyday smoker but she denies any history of COPD, CAD, or hypertension as is documented in her chart.  No previous history of asthma.  She is not using any inhalers.  She took a home COVID test that was negative  Past Medical History:  Diagnosis Date   Celiac disease    COPD (chronic obstructive pulmonary disease) (HCC)    Coronary artery disease    Depression    Hypertension    Thyroid disease     There are no problems to display for this patient.   Past Surgical History:  Procedure Laterality Date   ABDOMINAL HYSTERECTOMY      OB History   No obstetric history on file.      Home Medications    Prior to Admission medications   Medication Sig Start Date End Date Taking? Authorizing Provider  albuterol (VENTOLIN HFA) 108 (90 Base) MCG/ACT inhaler Inhale 2 puffs into the lungs every 4 (four) hours as needed. 11/13/21  Yes Becky Augusta, NP  benzonatate (TESSALON) 100 MG capsule Take 2 capsules (200 mg total) by mouth every 8 (eight) hours. 11/13/21  Yes Becky Augusta, NP  predniSONE (DELTASONE) 20 MG tablet Take 3 tablets (60 mg total) by mouth daily with breakfast for 5 days. 3 tablets daily for 5 days. 11/13/21 11/18/21 Yes Becky Augusta, NP  promethazine-dextromethorphan (PROMETHAZINE-DM) 6.25-15 MG/5ML syrup Take 5 mLs by mouth 4 (four) times  daily as needed. 11/13/21  Yes Becky Augusta, NP  Spacer/Aero-Holding Deretha [place] (AEROCHAMBER MV) inhaler Use as instructed 11/13/21  Yes Becky Augusta, NP    Family History Family History  Problem Relation Age of Onset   Diabetes Mother    Congestive Heart Failure Mother    Diabetes Sister    Diabetes Brother    Diabetes Maternal Grandmother     Social History Social History   Tobacco Use   Smoking status: Every Day    Packs/day: 1.00    Types: Cigarettes   Smokeless tobacco: Never  Vaping Use   Vaping Use: Never used  Substance Use Topics   Alcohol use: No   Drug use: Never     Allergies   Erythromycin, Gluten meal, and Penicillins   Review of Systems Review of Systems  Constitutional:  Negative for fever.  HENT:  Negative for congestion, ear pain, rhinorrhea and sore throat.   Respiratory:  Positive for cough, chest tightness and shortness of breath. Negative for wheezing.      Physical Exam Triage Vital Signs ED Triage Vitals  Enc Vitals Group     BP      Pulse      Resp      Temp      Temp src      SpO2      Weight  Height      Head Circumference      Peak Flow      Pain Score      Pain Loc      Pain Edu?      Excl. in [place]?    No data found.  Updated Vital Signs BP 106/72 (BP Location: Left Arm)   Pulse 69   Temp 98.8 F (37.1 C) (Oral)   Resp 18   Ht 5\' 3"  (1.6 m)   Wt 132 lb 0.9 oz (59.9 kg)   SpO2 98%   BMI 23.39 kg/m   Visual Acuity Right Eye Distance:   Left Eye Distance:   Bilateral Distance:    Right Eye Near:   Left Eye Near:    Bilateral Near:     Physical Exam Vitals and nursing note reviewed.  Constitutional:      Appearance: Normal appearance. She is not ill-appearing.  HENT:     Head: Normocephalic and atraumatic.  Cardiovascular:     Rate and Rhythm: Normal rate and regular rhythm.     Pulses: Normal pulses.     Heart sounds: Normal heart sounds. No murmur heard.    No friction rub. No gallop.  Pulmonary:      Effort: Pulmonary effort is normal.     Breath sounds: No wheezing, rhonchi or rales.  Skin:    General: Skin is warm and dry.     Capillary Refill: Capillary refill takes less than 2 seconds.     Findings: No erythema or rash.  Neurological:     General: No focal deficit present.     Mental Status: She is alert and oriented to person, place, and time.  Psychiatric:        Mood and Affect: Mood normal.        Behavior: Behavior normal.        Thought Content: Thought content normal.        Judgment: Judgment normal.      UC Treatments / Results  Labs (all labs ordered are listed, but only abnormal results are displayed) Labs Reviewed - No data to display  EKG   Radiology DG Chest 2 View  Result Date: 11/13/2021 CLINICAL DATA:  Cough and short of breath EXAM: CHEST - 2 VIEW COMPARISON:  None Available. FINDINGS: The heart size and mediastinal contours are within normal limits. Both lungs are clear. The visualized skeletal structures are unremarkable. IMPRESSION: No active cardiopulmonary disease. Electronically Signed   By: 01/14/2022 M.D.   On: 11/13/2021 19:15    Procedures Procedures (including critical care time)  Medications Ordered in UC Medications - No data to display  Initial Impression / Assessment and Plan / UC Course  I have reviewed the triage vital signs and the nursing notes.  Pertinent labs & imaging results that were available during my care of the patient were reviewed by me and considered in my medical decision making (see chart for details).  Patient is a very pleasant, nontoxic-appearing 41 year old female here for evaluation of 6 days worth of respiratory complaints as outlined in HPI above.  Patient denies any URI symptoms.  She states that her cough was initially productive but that has tapered off.  She is complaining of significant chest tightness and shortness of breath.  Her shortness that she has a history of COPD, CAD, and hypertension.  She  states that she has noted these medical problems and she does not take any medications or inhalers.  No  history of asthma.  Patient's physical exam reveals S1-S2 heart sounds with regular rate and rhythm and lung sounds that are decreased diffusely in all lung fields.  Patient is an everyday smoker.  I will obtain chest x-ray to rule out acute cardiopulmonary process.  Chest x-ray independently reviewed and evaluated by me.  Impression: The lung spaces are well pneumatized.  Heart size within normal limits.  Costophrenic angles are crisp.  No definitive infiltrate or effusion.  Radiology overread is pending. Radiology impression states heart size and mediastinal contours are within normal limits and both lungs are clear.  No active cardiopulmonary disease.  We will discharge patient home with an albuterol inhaler, spacer, prednisone, Tessalon Perles, and Promethazine DM cough syrup to help with her cough symptoms and chest tightness.  Patient was likely has a viral respiratory infection.  Return precautions reviewed  Final Clinical Impressions(s) / UC Diagnoses   Final diagnoses:  Viral respiratory illness     Discharge Instructions      Your chest x-ray did not demonstrate the presence of either bronchitis or pneumonia.  I do believe you have a viral respiratory infection.  Use the albuterol inhaler with a spacer, 2 puffs every 4-6 hours, as needed for shortness of breath and cough.  In the morning start the prednisone.  You will take 60 mg each morning at breakfast time for 5 days to help with pulmonary inflammation.  Use the Tessalon Perles every 8 hours on the days needed for cough.  Take them with a small sip of water.  They may give you some numbness to the base of your tongue or metallic taste in mouth, this is normal.  Use the Promethazine DM cough syrup at bedtime as needed for control of cough symptoms and sleep.  This medicine will make you drowsy.  Please return for  reevaluation for any new or worsening symptoms.     ED Prescriptions     Medication Sig Dispense Auth. Provider   albuterol (VENTOLIN HFA) 108 (90 Base) MCG/ACT inhaler Inhale 2 puffs into the lungs every 4 (four) hours as needed. 18 g Becky Augusta, NP   Spacer/Aero-Holding Chambers (AEROCHAMBER MV) inhaler Use as instructed 1 each Becky Augusta, NP   benzonatate (TESSALON) 100 MG capsule Take 2 capsules (200 mg total) by mouth every 8 (eight) hours. 21 capsule Becky Augusta, NP   predniSONE (DELTASONE) 20 MG tablet Take 3 tablets (60 mg total) by mouth daily with breakfast for 5 days. 3 tablets daily for 5 days. 15 tablet Becky Augusta, NP   promethazine-dextromethorphan (PROMETHAZINE-DM) 6.25-15 MG/5ML syrup Take 5 mLs by mouth 4 (four) times daily as needed. 118 mL Becky Augusta, NP      PDMP not reviewed this encounter.   Becky Augusta, NP 11/13/21 1921

## 2021-11-13 NOTE — ED Triage Notes (Signed)
Pt c/o cough, shortness of breath, chest tightness. Started about 6 days ago. Denies lung prior problems or fever. Pt states she took a covid test at home and was negative.

## 2022-06-29 ENCOUNTER — Ambulatory Visit
Admission: EM | Admit: 2022-06-29 | Discharge: 2022-06-29 | Disposition: A | Discharge status: Home / Self Care | Payer: No Typology Code available for payment source | Attending: Emergency Medicine | Admitting: Emergency Medicine

## 2022-06-29 DIAGNOSIS — H6993 Unspecified Eustachian tube disorder, bilateral: Secondary | ICD-10-CM

## 2022-06-29 MED ORDER — IPRATROPIUM BROMIDE 0.06 % NA SOLN: 2.0000 | Freq: Four times a day (QID) | NASAL | 12 refills | Status: DC

## 2022-06-29 MED ORDER — FLUTICASONE PROPIONATE 50 MCG/ACT NA SUSP: 2.0000 | Freq: Every day | NASAL | 1 refills | Status: DC

## 2022-06-29 NOTE — Discharge Instructions (Addendum)
Use the Atrovent nasal spray, 2 squirts in each nostril every 6 hours, to help with nasal congestion.  Take over-the-counter Zyrtec, Claritin, or Allegra once daily to help with allergic symptoms.  Instill 2 squirts of fluticasone in each nostril at bedtime nightly.  And the nasal away from the septum of your nose and follow each set of squirts with 1 squirt of nasal saline to push the particles up into your turbinates where they will take effect.  Continue to equalize your ears as shown to help clear mucus from eustachian tubes and maintain patency.   If your symptoms do not improve you need to follow-up with ENT.

## 2022-06-29 NOTE — ED Triage Notes (Signed)
Pt c/o bilateral ears aching onset 06/08/22

## 2022-06-29 NOTE — ED Provider Notes (Signed)
[facility]    CSN: [id number] Arrival date & time: 06/29/22  1116      History   Chief Complaint Chief Complaint  Patient presents with   Otalgia    Bilateral     HPI Sheryl Marshall is a 42 y.o. female.   HPI  42 year old female here for evaluation of bilateral ear pain.  The patient reports that she is experiencing an ache in both of her ears since 06/08/2022.  She states that her symptoms began with a headache and typically that is how her ear infections began.  She denies any fever, drainage from her ears, changes in hearing, or ringing in her ears.  She denies runny nose, nasal congestion, or cough.  Past Medical History:  Diagnosis Date   Celiac disease    COPD (chronic obstructive pulmonary disease) ([place])    Coronary artery disease    Depression    Hypertension    Thyroid disease     There are no problems to display for this patient.   Past Surgical History:  Procedure Laterality Date   ABDOMINAL HYSTERECTOMY      OB History   No obstetric history on file.      Home Medications    Prior to Admission medications   Medication Sig Start Date End Date Taking? Authorizing Provider  fluticasone (FLONASE) 50 MCG/ACT nasal spray Place 2 sprays into both nostrils daily. 06/29/22  Yes Margarette Canada, NP  ipratropium (ATROVENT) 0.06 % nasal spray Place 2 sprays into both nostrils 4 (four) times daily. 06/29/22  Yes Margarette Canada, NP  albuterol (VENTOLIN HFA) 108 (90 Base) MCG/ACT inhaler Inhale 2 puffs into the lungs every 4 (four) hours as needed. 11/13/21   Margarette Canada, NP  Spacer/Aero-Holding Josiah Lobo (AEROCHAMBER MV) inhaler Use as instructed 11/13/21   Margarette Canada, NP    Family History Family History  Problem Relation Age of Onset   Diabetes Mother    Congestive Heart Failure Mother    Diabetes Sister    Diabetes Brother    Diabetes Maternal Grandmother     Social History Social History   Tobacco Use   Smoking status: Every Day     Packs/day: 1.00    Types: Cigarettes   Smokeless tobacco: Never  Vaping Use   Vaping Use: Never used  Substance Use Topics   Alcohol use: No   Drug use: Never     Allergies   Erythromycin, Gluten meal, and Penicillins   Review of Systems Review of Systems  Constitutional:  Negative for fever.  HENT:  Positive for ear pain. Negative for congestion, ear discharge, hearing loss, rhinorrhea and tinnitus.   Respiratory:  Negative for cough.      Physical Exam Triage Vital Signs ED Triage Vitals  Enc Vitals Group     BP 06/29/22 1159 113/69     Pulse Rate 06/29/22 1159 88     Resp --      Temp 06/29/22 1159 98.3 F (36.8 C)     Temp Source 06/29/22 1159 Oral     SpO2 06/29/22 1159 98 %     Weight 06/29/22 1158 150 lb (68 kg)     Height 06/29/22 1158 5' 4"$  (1.626 m)     Head Circumference --      Peak Flow --      Pain Score 06/29/22 1158 5     Pain Loc --      Pain Edu? --      Excl.  in [place]? --    No data found.  Updated Vital Signs BP 113/69 (BP Location: Left Arm)   Pulse 88   Temp 98.3 F (36.8 C) (Oral)   Ht 5' 4"$  (1.626 m)   Wt 150 lb (68 kg)   SpO2 98%   BMI 25.75 kg/m   Visual Acuity Right Eye Distance:   Left Eye Distance:   Bilateral Distance:    Right Eye Near:   Left Eye Near:    Bilateral Near:     Physical Exam Vitals and nursing note reviewed.  Constitutional:      Appearance: Normal appearance. She is not ill-appearing.  HENT:     Head: Normocephalic and atraumatic.     Right Ear: Tympanic membrane, ear canal and external ear normal. There is no impacted cerumen.     Left Ear: Tympanic membrane, ear canal and external ear normal. There is no impacted cerumen.  Cardiovascular:     Rate and Rhythm: Normal rate and regular rhythm.     Pulses: Normal pulses.     Heart sounds: Normal heart sounds. No murmur heard.    No friction rub. No gallop.  Pulmonary:     Effort: Pulmonary effort is normal.     Breath sounds: Normal breath  sounds. No wheezing, rhonchi or rales.  Skin:    General: Skin is warm and dry.     Capillary Refill: Capillary refill takes less than 2 seconds.     Findings: No erythema or rash.  Neurological:     General: No focal deficit present.     Mental Status: She is alert and oriented to person, place, and time.      UC Treatments / Results  Labs (all labs ordered are listed, but only abnormal results are displayed) Labs Reviewed - No data to display  EKG   Radiology No results found.  Procedures Procedures (including critical care time)  Medications Ordered in UC Medications - No data to display  Initial Impression / Assessment and Plan / UC Course  I have reviewed the triage vital signs and the nursing notes.  Pertinent labs & imaging results that were available during my care of the patient were reviewed by me and considered in my medical decision making (see chart for details).   Patient presents for evaluation of 3 weeks worth of bilateral ear pain without associated fever, URI symptoms, ringing in her ear, or drainage from the ear.  The patient scares are normal on exam with pearly gray tympanic membranes bilaterally with normal light reflex and clear external auditory canals.  Patient does not endorse tenderness with external palpation of the eustachian tubes bilaterally.  I did ask the patient to attempt to pop her ears in the exam room and she was unable to.  I suspect that the patient's pain is coming from eustachian tube dysfunction.  I will prescribe her Atrovent nasal spray and Flonase to help clear the eustachian tubes and decrease upper respiratory inflammation.  I have also instructed her to periodically do the Valsalva maneuver throughout the day to try and open her eustachian tubes.  If her symptoms do not improve she should follow-up with ear nose and throat for further evaluation.   Final Clinical Impressions(s) / UC Diagnoses   Final diagnoses:  Eustachian tube  disorder, bilateral     Discharge Instructions      Use the Atrovent nasal spray, 2 squirts in each nostril every 6 hours, to help with nasal congestion.  Take over-the-counter Zyrtec, Claritin, or Allegra once daily to help with allergic symptoms.  Instill 2 squirts of fluticasone in each nostril at bedtime nightly.  And the nasal away from the septum of your nose and follow each set of squirts with 1 squirt of nasal saline to push the particles up into your turbinates where they will take effect.  Continue to equalize your ears as shown to help clear mucus from eustachian tubes and maintain patency.   If your symptoms do not improve you need to follow-up with ENT.     ED Prescriptions     Medication Sig Dispense Auth. Provider   ipratropium (ATROVENT) 0.06 % nasal spray Place 2 sprays into both nostrils 4 (four) times daily. 15 mL Margarette Canada, NP   fluticasone [facility]) 50 MCG/ACT nasal spray Place 2 sprays into both nostrils daily. 18.2 mL Margarette Canada, NP      PDMP not reviewed this encounter.   Margarette Canada, NP 06/29/22 1213

## 2023-01-19 ENCOUNTER — Encounter: Payer: Self-pay | Admitting: Emergency Medicine

## 2023-01-19 ENCOUNTER — Ambulatory Visit
Admission: EM | Admit: 2023-01-19 | Discharge: 2023-01-19 | Disposition: A | Discharge status: Home / Self Care | Payer: No Typology Code available for payment source | Attending: Urgent Care | Admitting: Urgent Care

## 2023-01-19 DIAGNOSIS — J441 Chronic obstructive pulmonary disease with (acute) exacerbation: Secondary | ICD-10-CM | POA: Diagnosis not present

## 2023-01-19 MED ORDER — AZITHROMYCIN 250 MG PO TABS: ORAL_TABLET | ORAL | 0 refills | Status: DC

## 2023-01-19 MED ORDER — PREDNISONE 20 MG PO TABS: 60.0000 mg | ORAL_TABLET | Freq: Every day | ORAL | 0 refills | Status: AC

## 2023-01-19 MED ORDER — HYDROCOD POLI-CHLORPHE POLI ER 10-8 MG/5ML PO SUER: 5.0000 mL | Freq: Two times a day (BID) | ORAL | 0 refills | Status: AC | PRN

## 2023-01-19 NOTE — Discharge Instructions (Signed)
Follow up here or with your primary care provider if your symptoms are worsening or not improving with treatment.     

## 2023-01-19 NOTE — ED Provider Notes (Signed)
[facility]    CSN: [id number] Arrival date & time: 01/19/23  1553      History   Chief Complaint Chief Complaint  Patient presents with   Cough    HPI Sheryl Marshall is a 42 y.o. female.    Cough  Presents to urgent care with complaint of nonproductive cough and shortness of breath x 8 days.  She states her symptoms started approximately on Labor Day and include upper respiratory symptoms, nasal congestion with mucus discharge.  Symptoms worsening and now causing cough, chest tightness.  PMH includes COPD, CAD  Past Medical History:  Diagnosis Date   Celiac disease    COPD (chronic obstructive pulmonary disease) ([facility])    Coronary artery disease    Depression    Hypertension    Thyroid disease     There are no problems to display for this patient.   Past Surgical History:  Procedure Laterality Date   ABDOMINAL HYSTERECTOMY      OB History   No obstetric history on file.      Home Medications    Prior to Admission medications   Medication Sig Start Date End Date Taking? Authorizing Provider  albuterol (VENTOLIN HFA) 108 (90 Base) MCG/ACT inhaler Inhale 2 puffs into the lungs every 4 (four) hours as needed. 11/13/21   Becky Augusta, NP  fluticasone (FLONASE) 50 MCG/ACT nasal spray Place 2 sprays into both nostrils daily. 06/29/22   Becky Augusta, NP  ipratropium (ATROVENT) 0.06 % nasal spray Place 2 sprays into both nostrils 4 (four) times daily. 06/29/22   Becky Augusta, NP  Spacer/Aero-Holding Deretha Emory (AEROCHAMBER MV) inhaler Use as instructed 11/13/21   Becky Augusta, NP    Family History Family History  Problem Relation Age of Onset   Diabetes Mother    Congestive Heart Failure Mother    Diabetes Sister    Diabetes Brother    Diabetes Maternal Grandmother     Social History Social History   Tobacco Use   Smoking status: Every Day    Current packs/day: 1.00    Types: Cigarettes   Smokeless tobacco: Never  Vaping Use   Vaping  status: Never Used  Substance Use Topics   Alcohol use: No   Drug use: Never     Allergies   Erythromycin, Gluten meal, and Penicillins   Review of Systems Review of Systems  Respiratory:  Positive for cough.      Physical Exam Triage Vital Signs ED Triage Vitals  Encounter Vitals Group     BP 01/19/23 1614 120/79     Systolic BP Percentile --      Diastolic BP Percentile --      Pulse Rate 01/19/23 1614 (!) 104     Resp 01/19/23 1614 18     Temp 01/19/23 1614 98.9 F (37.2 C)     Temp Source 01/19/23 1614 Oral     SpO2 01/19/23 1614 94 %     Weight --      Height --      Head Circumference --      Peak Flow --      Pain Score 01/19/23 1613 0     Pain Loc --      Pain Education --      Exclude from Growth Chart --    No data found.  Updated Vital Signs BP 120/79 (BP Location: Right Arm)   Pulse (!) 104   Temp 98.9 F (37.2 C) (Oral)   Resp  18   SpO2 94%   Visual Acuity Right Eye Distance:   Left Eye Distance:   Bilateral Distance:    Right Eye Near:   Left Eye Near:    Bilateral Near:     Physical Exam Vitals reviewed.  Constitutional:      Appearance: Normal appearance.  Cardiovascular:     Rate and Rhythm: Normal rate and regular rhythm.     Pulses: Normal pulses.     Heart sounds: Normal heart sounds.  Pulmonary:     Effort: Pulmonary effort is normal.     Breath sounds: Wheezing present.  Skin:    General: Skin is warm and dry.  Neurological:     General: No focal deficit present.     Mental Status: She is alert and oriented to person, place, and time.  Psychiatric:        Mood and Affect: Mood normal.        Behavior: Behavior normal.      UC Treatments / Results  Labs (all labs ordered are listed, but only abnormal results are displayed) Labs Reviewed - No data to display  EKG   Radiology No results found.  Procedures Procedures (including critical care time)  Medications Ordered in UC Medications - No data to  display  Initial Impression / Assessment and Plan / UC Course  I have reviewed the triage vital signs and the nursing notes.  Pertinent labs & imaging results that were available during my care of the patient were reviewed by me and considered in my medical decision making (see chart for details).   Sheryl Marshall is a 43 y.o. female presenting with cough. Patient is afebrile without recent antipyretics, satting well on room air. Overall is well appearing, well hydrated, without respiratory distress. Pulmonary exam is remarkable for wheezing.   Reviewed relevant chart history.   Patient symptoms are consistent with an acute viral or postviral process.  However given her history of COPD in the context of wheezing heard on exam, I am concerned with acute exacerbation of COPD and will prescribe a Z-Pak to cover any possible bacterial involvement given her risk of poor outcome.  Also prescribing prednisone to relieve her exacerbation symptoms.  Patient endorses history of allergy to erythromycin but states she has tolerated azithromycin without issue.  Counseled patient on potential for adverse effects with medications prescribed/recommended today, ER and return-to-clinic precautions discussed, patient verbalized understanding and agreement with care plan.  Final Clinical Impressions(s) / UC Diagnoses   Final diagnoses:  None   Discharge Instructions   None    ED Prescriptions   None    PDMP not reviewed this encounter.   Charma Igo, [place] 01/19/23 [phone]

## 2023-01-19 NOTE — ED Triage Notes (Signed)
Pt presents with a non-productive cough and SOB x 5 days.

## 2023-02-23 ENCOUNTER — Other Ambulatory Visit: Payer: Self-pay | Admitting: Student

## 2023-02-23 DIAGNOSIS — Z1231 Encounter for screening mammogram for malignant neoplasm of breast: Secondary | ICD-10-CM

## 2023-02-27 ENCOUNTER — Ambulatory Visit (INDEPENDENT_AMBULATORY_CARE_PROVIDER_SITE_OTHER): Payer: No Typology Code available for payment source

## 2023-02-27 ENCOUNTER — Ambulatory Visit
Admission: EM | Admit: 2023-02-27 | Discharge: 2023-02-27 | Disposition: A | Discharge status: Home / Self Care | Payer: No Typology Code available for payment source

## 2023-02-27 ENCOUNTER — Encounter: Payer: Self-pay | Admitting: Emergency Medicine

## 2023-02-27 DIAGNOSIS — R059 Cough, unspecified: Secondary | ICD-10-CM | POA: Diagnosis not present

## 2023-02-27 DIAGNOSIS — R062 Wheezing: Secondary | ICD-10-CM | POA: Diagnosis not present

## 2023-02-27 DIAGNOSIS — R051 Acute cough: Secondary | ICD-10-CM

## 2023-02-27 MED ORDER — DOXYCYCLINE HYCLATE 100 MG PO CAPS
100.0000 mg | ORAL_CAPSULE | Freq: Two times a day (BID) | ORAL | 0 refills | Status: AC
Start: 2023-02-27 — End: 2023-03-06

## 2023-02-27 MED ORDER — ALBUTEROL SULFATE HFA 108 (90 BASE) MCG/ACT IN AERS
1.0000 | INHALATION_SPRAY | Freq: Four times a day (QID) | RESPIRATORY_TRACT | 0 refills | Status: DC | PRN
Start: 2023-02-27 — End: 2024-02-13

## 2023-02-27 MED ORDER — IPRATROPIUM-ALBUTEROL 0.5-2.5 (3) MG/3ML IN SOLN
3.0000 mL | Freq: Once | RESPIRATORY_TRACT | Status: AC
  Administered 2023-02-27: 3 mL via RESPIRATORY_TRACT

## 2023-02-27 MED ORDER — ATROVENT HFA 17 MCG/ACT IN AERS: 2.0000 | INHALATION_SPRAY | RESPIRATORY_TRACT | 12 refills | Status: DC | PRN

## 2023-02-27 NOTE — Discharge Instructions (Addendum)
Use Atrovent inhaler every 6 hours Can use albuterol inhaler as needed if no relief Start doxycycline  Drink plenty of fluids Continue prednisone as prescribed Follow up with your primary care physician if no improvement

## 2023-02-27 NOTE — ED Triage Notes (Signed)
Patient has ongoing SOB, wheezing, cough, and chest congestion since August.  Patient was seen here in September for these same symptoms and also saw her PCP on 02/22/23 for her SOB and COPD and finished her Prednisone.  Patient states that nothing has helped.

## 2023-02-27 NOTE — ED Provider Notes (Signed)
[facility]    CSN: [id number] Arrival date & time: 02/27/23  1103      History   Chief Complaint Chief Complaint  Patient presents with   Shortness of Breath   Wheezing   COPD    HPI Sheryl Marshall is a 42 y.o. female.   Patient presents with persistent shortness of breath wheezing and cough that started per patient in August.  She was seen here in September treated for COPD exacerbation.  She was seen by her PCP on the 14th and started on a 10-day prednisone course.  DuoNeb during PCP visit with documented improvement.  She has used an albuterol inhaler but is currently out of this medication.  She denies fever, chills.  She reports no improvement since starting the prednisone.    Past Medical History:  Diagnosis Date   Celiac disease    COPD (chronic obstructive pulmonary disease) ([facility])    Coronary artery disease    Depression    Hypertension    Thyroid disease     There are no problems to display for this patient.   Past Surgical History:  Procedure Laterality Date   ABDOMINAL HYSTERECTOMY      OB History   No obstetric history on file.      Home Medications    Prior to Admission medications   Medication Sig Start Date End Date Taking? Authorizing Provider  albuterol (VENTOLIN HFA) 108 (90 Base) MCG/ACT inhaler Inhale 1-2 puffs into the lungs every 6 (six) hours as needed for wheezing or shortness of breath. 02/27/23  Yes Ward, Tylene Fantasia, PA-C  doxycycline (VIBRAMYCIN) 100 MG capsule Take 1 capsule (100 mg total) by mouth 2 (two) times daily for 7 days. 02/27/23 03/06/23 Yes Ward, Tylene Fantasia, PA-C  ipratropium (ATROVENT HFA) 17 MCG/ACT inhaler Inhale 2 puffs into the lungs every 4 (four) hours as needed for wheezing. 02/27/23  Yes Ward, Tylene Fantasia, PA-C  predniSONE (DELTASONE) 20 MG tablet Take 20 mg by mouth daily with breakfast. 02/22/23  Yes [provider]  azithromycin (ZITHROMAX Z-PAK) 250 MG tablet Take 2 tablets (500 mg)  today, then 1 tablet (250 mg) for next 4 days. 01/19/23   Immordino, Jeannett Senior, FNP  fluticasone (FLONASE) 50 MCG/ACT nasal spray Place 2 sprays into both nostrils daily. 06/29/22   Becky Augusta, NP  ipratropium (ATROVENT) 0.06 % nasal spray Place 2 sprays into both nostrils 4 (four) times daily. 06/29/22   Becky Augusta, NP  Spacer/Aero-Holding Deretha Emory (AEROCHAMBER MV) inhaler Use as instructed 11/13/21   Becky Augusta, NP    Family History Family History  Problem Relation Age of Onset   Diabetes Mother    Congestive Heart Failure Mother    Diabetes Sister    Diabetes Brother    Diabetes Maternal Grandmother     Social History Social History   Tobacco Use   Smoking status: Every Day    Current packs/day: 1.00    Types: Cigarettes   Smokeless tobacco: Never  Vaping Use   Vaping status: Never Used  Substance Use Topics   Alcohol use: No   Drug use: Never     Allergies   Erythromycin, Gluten meal, and Penicillins   Review of Systems Review of Systems  Constitutional:  Negative for chills and fever.  HENT:  Positive for congestion. Negative for ear pain and sore throat.   Eyes:  Negative for pain and visual disturbance.  Respiratory:  Positive for cough, shortness of breath and wheezing.  Cardiovascular:  Negative for chest pain and palpitations.  Gastrointestinal:  Negative for abdominal pain and vomiting.  Genitourinary:  Negative for dysuria and hematuria.  Musculoskeletal:  Negative for arthralgias and back pain.  Skin:  Negative for color change and rash.  Neurological:  Negative for seizures and syncope.  All other systems reviewed and are negative.    Physical Exam Triage Vital Signs ED Triage Vitals  Encounter Vitals Group     BP 02/27/23 1151 111/86     Systolic BP Percentile --      Diastolic BP Percentile --      Pulse Rate 02/27/23 1151 87     Resp 02/27/23 1151 20     Temp 02/27/23 1151 98.5 F (36.9 C)     Temp Source 02/27/23 1151 Oral     SpO2  02/27/23 1151 90 %     Weight 02/27/23 1149 148 lb (67.1 kg)     Height 02/27/23 1149 5\' 4"  (1.626 m)     Head Circumference --      Peak Flow --      Pain Score 02/27/23 1149 0     Pain Loc --      Pain Education --      Exclude from Growth Chart --    No data found.  Updated Vital Signs BP 111/86 (BP Location: Left Arm)   Pulse 87   Temp 98.5 F (36.9 C) (Oral)   Resp 20   Ht 5\' 4"  (1.626 m)   Wt 148 lb (67.1 kg)   SpO2 90%   BMI 25.40 kg/m   Visual Acuity Right Eye Distance:   Left Eye Distance:   Bilateral Distance:    Right Eye Near:   Left Eye Near:    Bilateral Near:     Physical Exam Vitals and nursing note reviewed.  Constitutional:      General: She is not in acute distress.    Appearance: She is well-developed.  HENT:     Head: Normocephalic and atraumatic.  Eyes:     Conjunctiva/sclera: Conjunctivae normal.  Cardiovascular:     Rate and Rhythm: Normal rate and regular rhythm.     Heart sounds: No murmur heard. Pulmonary:     Effort: Pulmonary effort is normal. No respiratory distress.     Breath sounds: Wheezing and rales present.  Abdominal:     Palpations: Abdomen is soft.     Tenderness: There is no abdominal tenderness.  Musculoskeletal:        General: No swelling.     Cervical back: Neck supple.  Skin:    General: Skin is warm and dry.     Capillary Refill: Capillary refill takes less than 2 seconds.  Neurological:     Mental Status: She is alert.  Psychiatric:        Mood and Affect: Mood normal.      UC Treatments / Results  Labs (all labs ordered are listed, but only abnormal results are displayed) Labs Reviewed - No data to display  EKG   Radiology No results found.  Procedures Procedures (including critical care time)  Medications Ordered in UC Medications  ipratropium-albuterol (DUONEB) 0.5-2.5 (3) MG/3ML nebulizer solution 3 mL (3 mLs Nebulization Given 02/27/23 1218)    Initial Impression / Assessment and  Plan / UC Course  I have reviewed the triage vital signs and the nursing notes.  Pertinent labs & imaging results that were available during my care of the patient were reviewed  by me and considered in my medical decision making (see chart for details).     Will treat as COPD exacerbation.  Questionable history on chart.  Wheezing and rales heard on exam, improved with DuoNeb in clinic today.  O2 sats 96% on recheck after DuoNeb treatment.  Chest x-ray with no evidence of pneumonia.   doxycycline prescribed along with Atrovent and albuterol inhaler refilled.  Patient overall well-appearing in no acute distress, discharged home with return precautions Final Clinical Impressions(s) / UC Diagnoses   Final diagnoses:  Acute cough     Discharge Instructions      Use Atrovent inhaler every 6 hours Can use albuterol inhaler as needed if no relief Start doxycycline  Drink plenty of fluids Continue prednisone as prescribed Follow up with your primary care physician if no improvement     ED Prescriptions     Medication Sig Dispense Auth. Provider   ipratropium (ATROVENT HFA) 17 MCG/ACT inhaler Inhale 2 puffs into the lungs every 4 (four) hours as needed for wheezing. 1 each Ward, Tylene Fantasia, PA-C   albuterol (VENTOLIN HFA) 108 (90 Base) MCG/ACT inhaler Inhale 1-2 puffs into the lungs every 6 (six) hours as needed for wheezing or shortness of breath. 1 each Ward, Tylene Fantasia, PA-C   doxycycline (VIBRAMYCIN) 100 MG capsule Take 1 capsule (100 mg total) by mouth 2 (two) times daily for 7 days. 14 capsule Ward, Tylene Fantasia, PA-C      PDMP not reviewed this encounter.   Ward, Tylene Fantasia, PA-C 02/27/23 1304

## 2023-04-22 ENCOUNTER — Emergency Department
Admission: EM | Admit: 2023-04-22 | Discharge: 2023-04-22 | Discharge status: Against Medical Advice | Payer: No Typology Code available for payment source | Attending: Emergency Medicine | Admitting: Emergency Medicine

## 2023-04-22 ENCOUNTER — Other Ambulatory Visit: Payer: Self-pay

## 2023-04-22 ENCOUNTER — Encounter: Payer: Self-pay | Admitting: Emergency Medicine

## 2023-04-22 DIAGNOSIS — Z5321 Procedure and treatment not carried out due to patient leaving prior to being seen by health care provider: Secondary | ICD-10-CM | POA: Insufficient documentation

## 2023-04-22 DIAGNOSIS — R109 Unspecified abdominal pain: Secondary | ICD-10-CM | POA: Diagnosis present

## 2023-04-22 LAB — TYPE AND SCREEN
ABO/RH(D): AB POS
Antibody Screen: NEGATIVE

## 2023-04-22 LAB — CBC
HCT: 49.3 % — ABNORMAL HIGH (ref 36.0–46.0)
Hemoglobin: 16.2 g/dL — ABNORMAL HIGH (ref 12.0–15.0)
MCH: 29.2 pg (ref 26.0–34.0)
MCHC: 32.9 g/dL (ref 30.0–36.0)
MCV: 89 fL (ref 80.0–100.0)
Platelets: 237 10*3/uL (ref 150–400)
RBC: 5.54 MIL/uL — ABNORMAL HIGH (ref 3.87–5.11)
RDW: 13.3 % (ref 11.5–15.5)
WBC: 8.8 10*3/uL (ref 4.0–10.5)
nRBC: 0 % (ref 0.0–0.2)

## 2023-04-22 LAB — URINALYSIS, ROUTINE W REFLEX MICROSCOPIC
Bilirubin Urine: NEGATIVE
Glucose, UA: NEGATIVE mg/dL
Ketones, ur: 5 mg/dL — AB
Leukocytes,Ua: NEGATIVE
Nitrite: NEGATIVE
Protein, ur: NEGATIVE mg/dL
Specific Gravity, Urine: 1.026 (ref 1.005–1.030)
pH: 5 (ref 5.0–8.0)

## 2023-04-22 LAB — COMPREHENSIVE METABOLIC PANEL
ALT: 21 U/L (ref 0–44)
AST: 19 U/L (ref 15–41)
Albumin: 4.3 g/dL (ref 3.5–5.0)
Alkaline Phosphatase: 37 U/L — ABNORMAL LOW (ref 38–126)
Anion gap: 8 (ref 5–15)
BUN: 18 mg/dL (ref 6–20)
CO2: 21 mmol/L — ABNORMAL LOW (ref 22–32)
Calcium: 9 mg/dL (ref 8.9–10.3)
Chloride: 105 mmol/L (ref 98–111)
Creatinine, Ser: 0.76 mg/dL (ref 0.44–1.00)
GFR, Estimated: >60 mL/min (ref >60)
Glucose, Bld: 127 mg/dL — ABNORMAL HIGH (ref 70–99)
Potassium: 3.8 mmol/L (ref 3.5–5.1)
Sodium: 134 mmol/L — ABNORMAL LOW (ref 135–145)
Total Bilirubin: 0.7 mg/dL (ref <1.2)
Total Protein: 7.1 g/dL (ref 6.5–8.1)

## 2023-04-22 LAB — LIPASE, BLOOD: Lipase: 43 U/L (ref 11–51)

## 2023-04-22 NOTE — ED Triage Notes (Signed)
Patient wheeled to triage with complaints of abdominal pain all day today with x1 episode of runny stool. Patient states she then felt like she needed to go the rest of the day and when she went again she had bright red blood, no stool. Patient now states she feels weak all over.

## 2023-07-21 ENCOUNTER — Ambulatory Visit
Admission: RE | Admit: 2023-07-21 | Discharge: 2023-07-21 | Disposition: A | Discharge status: Home / Self Care | Source: Ambulatory Visit | Attending: Student | Admitting: Student

## 2023-07-21 DIAGNOSIS — Z1231 Encounter for screening mammogram for malignant neoplasm of breast: Secondary | ICD-10-CM | POA: Insufficient documentation

## 2024-02-13 ENCOUNTER — Encounter: Payer: Self-pay | Admitting: Emergency Medicine

## 2024-02-13 ENCOUNTER — Ambulatory Visit
Admission: EM | Admit: 2024-02-13 | Discharge: 2024-02-13 | Disposition: A | Discharge status: Home / Self Care | Attending: Student | Admitting: Student

## 2024-02-13 DIAGNOSIS — J441 Chronic obstructive pulmonary disease with (acute) exacerbation: Secondary | ICD-10-CM | POA: Diagnosis not present

## 2024-02-13 MED ORDER — AZITHROMYCIN 250 MG PO TABS
250.0000 mg | ORAL_TABLET | Freq: Every day | ORAL | 0 refills | Status: AC
Start: 2024-02-13 — End: ?

## 2024-02-13 MED ORDER — IPRATROPIUM-ALBUTEROL 0.5-2.5 (3) MG/3ML IN SOLN
3.0000 mL | Freq: Once | RESPIRATORY_TRACT | Status: AC
  Administered 2024-02-13: 3 mL via RESPIRATORY_TRACT

## 2024-02-13 MED ORDER — PREDNISONE 10 MG (21) PO TBPK
ORAL_TABLET | Freq: Every day | ORAL | 0 refills | Status: AC
Start: 2024-02-13 — End: ?

## 2024-02-13 MED ORDER — ALBUTEROL SULFATE HFA 108 (90 BASE) MCG/ACT IN AERS
1.0000 | INHALATION_SPRAY | Freq: Four times a day (QID) | RESPIRATORY_TRACT | 0 refills | Status: AC | PRN
Start: 2024-02-13 — End: ?

## 2024-02-13 NOTE — Discharge Instructions (Signed)
-  Azithromycin  (Z-pack) is an antibiotic that will prevent infection -Prednisone  taper. Take this earlier in the day - it can cause energy. -Albuterol  inhaler as needed for cough, wheezing, shortness of breath, 1 to 2 puffs every 6 hours as needed. -If the chest pain returns, seek additional care: go to the ER or call 911 -If your cough, shortness of breath, etc gets worse - seek additional care

## 2024-02-13 NOTE — ED Triage Notes (Signed)
 Patient states that Monday morning she woke up with left sided chest pain that lasted till Wed.  Patient states that now she feels like her heartbeat is racing and SOB.  Patient has history of COPD.  Patient that it comes a goes.  Patient denies numbness or tingling.

## 2024-02-13 NOTE — ED Provider Notes (Signed)
 [facility]    CSN: [id number] Arrival date & time: 02/13/24  1246      History   Chief Complaint Chief Complaint  Patient presents with   Chest Pain   Shortness of Breath    HPI Sheryl Marshall is a 43 y.o. female presenting with shortness of breath and sensation of heart racing.  She has a history of COPD, CAD, hypertension, celiac, thyroid disease.  Symptoms started on 02/07/24 with left-sided chest pain that felt like stabbing in breast.  This improved after about 2 days, and she denies chest pain at the time of this visit.  Did not seek care while experiencing the chest pain.  Today, her predominant symptoms are tachycardia and dyspnea on exertion.  Cough is nonproductive.  Endorses subjective chills.  Diagnosis of COPD, but does not currently have a regimen of inhalers at home.  Status post hysterectomy.   HPI  Past Medical History:  Diagnosis Date   Celiac disease    COPD (chronic obstructive pulmonary disease) (HCC)    Coronary artery disease    Depression    Hypertension    Thyroid disease     There are no active problems to display for this patient.   Past Surgical History:  Procedure Laterality Date   ABDOMINAL HYSTERECTOMY      OB History   No obstetric history on file.      Home Medications    Prior to Admission medications   Medication Sig Start Date End Date Taking? Authorizing Provider  albuterol  (VENTOLIN  HFA) 108 (90 Base) MCG/ACT inhaler Inhale 1-2 puffs into the lungs every 6 (six) hours as needed for wheezing or shortness of breath. 02/13/24  Yes Arlyss Leita BRAVO, PA-C  azithromycin  (ZITHROMAX  Z-PAK) 250 MG tablet Take 1 tablet (250 mg total) by mouth daily. Take two pills on day 1. Take 1 pill on days 2-5. 02/13/24  Yes Lilliann Rossetti E, PA-C  predniSONE  (STERAPRED UNI-PAK 21 TAB) 10 MG (21) TBPK tablet Take by mouth daily. Take 6 tabs by mouth daily  for 2 days, then 5 tabs for 2 days, then 4 tabs for 2 days, then 3 tabs for 2  days, 2 tabs for 2 days, then 1 tab by mouth daily for 2 days 02/13/24  Yes Arlyss Leita BRAVO, PA-C    Family History Family History  Problem Relation Age of Onset   Diabetes Mother    Congestive Heart Failure Mother    Diabetes Sister    Diabetes Brother    Diabetes Maternal Grandmother     Social History Social History   Tobacco Use   Smoking status: Every Day    Current packs/day: 1.00    Types: Cigarettes   Smokeless tobacco: Never  Vaping Use   Vaping status: Never Used  Substance Use Topics   Alcohol use: No   Drug use: Never     Allergies   Erythromycin, Gluten meal, and Penicillins   Review of Systems Review of Systems  Constitutional:  Negative for appetite change, chills and fever.  HENT:  Negative for congestion, ear pain, rhinorrhea, sinus pressure, sinus pain and sore throat.   Eyes:  Negative for redness and visual disturbance.  Respiratory:  Positive for cough and shortness of breath. Negative for chest tightness and wheezing.   Cardiovascular:  Negative for chest pain and palpitations.  Gastrointestinal:  Negative for abdominal pain, constipation, diarrhea, nausea and vomiting.  Genitourinary:  Negative for dysuria, frequency and urgency.  Musculoskeletal:  Negative for myalgias.  Neurological:  Negative for dizziness, weakness and headaches.  Psychiatric/Behavioral:  Negative for confusion.   All other systems reviewed and are negative.    Physical Exam Triage Vital Signs ED Triage Vitals [02/13/24 1256]  Encounter Vitals Group     BP      Girls Systolic BP Percentile      Girls Diastolic BP Percentile      Boys Systolic BP Percentile      Boys Diastolic BP Percentile      Pulse      Resp      Temp      Temp src      SpO2      Weight      Height      Head Circumference      Peak Flow      Pain Score 2     Pain Loc      Pain Education      Exclude from Growth Chart    No data found.  Updated Vital Signs BP 114/79 (BP Location:  Right Arm)   Pulse (!) 101   Temp 98.7 F (37.1 C) (Oral)   Resp 16   Ht 5' 4 (1.626 m)   Wt 132 lb 0.9 oz (59.9 kg)   SpO2 93%   BMI 22.67 kg/m   Visual Acuity Right Eye Distance:   Left Eye Distance:   Bilateral Distance:    Right Eye Near:   Left Eye Near:    Bilateral Near:     Physical Exam Vitals reviewed.  Constitutional:      General: She is not in acute distress.    Appearance: Normal appearance. She is not ill-appearing.  HENT:     Head: Normocephalic and atraumatic.     Right Ear: Tympanic membrane, ear canal and external ear normal. No tenderness. No middle ear effusion. There is no impacted cerumen. Tympanic membrane is not perforated, erythematous, retracted or bulging.     Left Ear: Tympanic membrane, ear canal and external ear normal. No tenderness.  No middle ear effusion. There is no impacted cerumen. Tympanic membrane is not perforated, erythematous, retracted or bulging.     Nose: Nose normal. No congestion.     Mouth/Throat:     Mouth: Mucous membranes are moist.     Pharynx: Uvula midline. No oropharyngeal exudate or posterior oropharyngeal erythema.  Eyes:     Extraocular Movements: Extraocular movements intact.     Pupils: Pupils are equal, round, and reactive to light.  Cardiovascular:     Rate and Rhythm: Normal rate and regular rhythm.     Heart sounds: Normal heart sounds.  Pulmonary:     Effort: Pulmonary effort is normal.     Breath sounds: Wheezing present. No decreased breath sounds, rhonchi or rales.     Comments: Scattered wheezes left and right upper lung fields, resolved following DuoNeb treatment  Abdominal:     Palpations: Abdomen is soft.     Tenderness: There is no abdominal tenderness. There is no guarding or rebound.  Lymphadenopathy:     Cervical: No cervical adenopathy.     Right cervical: No superficial cervical adenopathy.    Left cervical: No superficial cervical adenopathy.  Neurological:     General: No focal deficit  present.     Mental Status: She is alert and oriented to person, place, and time.  Psychiatric:        Mood and Affect: Mood normal.  Behavior: Behavior normal.        Thought Content: Thought content normal.        Judgment: Judgment normal.      UC Treatments / Results  Labs (all labs ordered are listed, but only abnormal results are displayed) Labs Reviewed - No data to display  EKG   Radiology No results found.  Procedures Procedures (including critical care time)  Medications Ordered in UC Medications  ipratropium-albuterol  (DUONEB) 0.5-2.5 (3) MG/3ML nebulizer solution 3 mL (3 mLs Nebulization Given 02/13/24 1318)    Initial Impression / Assessment and Plan / UC Course  I have reviewed the triage vital signs and the nursing notes.  Pertinent labs & imaging results that were available during my care of the patient were reviewed by me and considered in my medical decision making (see chart for details).     This patient is a 43 year old female with a history of COPD, presenting with COPD exacerbation. Of note, this patient was experiencing chest pain, 9/29-10/1, and did not seek care at that time. The chest pain has resolved at the time of this visit. EKG NSR, unchanged compared with 04/2023 EKG. we discussed that while an EKG does not totally exclude cardiac pathology, a normal EKG is a reassuring sign.  She understands that if the chest pain returns, she should seek additional immediate medical attention: Go to the emergency department or call 911. Initially tachy at 101, but at 49 on repeat.  Wells score for PE is 0. On exam, few scattered wheezes were auscultated in the left and right upper lung fields.  Following DuoNeb treatment, resolution of wheezing.  Will manage as COPD exacerbation with prednisone  taper, albuterol  inhaler, and azithromycin .  She is penicillin allergic and erythromycin allergic, but has tolerated azithromycin  in the past.  Strict return  precautions.  If the chest pain returns, seek immediate medical attention or call 911.  Worsening shortness of breath, new fevers, new cough productive of purulent sputum-seek additional medical attention.  Level 4 for acute exacerbation of chronic illness and prescription drug management.  Final Clinical Impressions(s) / UC Diagnoses   Final diagnoses:  COPD exacerbation (HCC)     Discharge Instructions      -Azithromycin  (Z-pack) is an antibiotic that will prevent infection -Prednisone  taper. Take this earlier in the day - it can cause energy. -Albuterol  inhaler as needed for cough, wheezing, shortness of breath, 1 to 2 puffs every 6 hours as needed. -If the chest pain returns, seek additional care: go to the ER or call 911 -If your cough, shortness of breath, etc gets worse - seek additional care     ED Prescriptions     Medication Sig Dispense Auth. Provider   predniSONE  (STERAPRED UNI-PAK 21 TAB) 10 MG (21) TBPK tablet Take by mouth daily. Take 6 tabs by mouth daily  for 2 days, then 5 tabs for 2 days, then 4 tabs for 2 days, then 3 tabs for 2 days, 2 tabs for 2 days, then 1 tab by mouth daily for 2 days 42 tablet Jarely Juncaj E, PA-C   albuterol  (VENTOLIN  HFA) 108 (90 Base) MCG/ACT inhaler Inhale 1-2 puffs into the lungs every 6 (six) hours as needed for wheezing or shortness of breath. 1 each Arlyss Leita BRAVO, PA-C   azithromycin  (ZITHROMAX  Z-PAK) 250 MG tablet Take 1 tablet (250 mg total) by mouth daily. Take two pills on day 1. Take 1 pill on days 2-5. 6 tablet Naesha Buckalew E, PA-C  PDMP not reviewed this encounter.   Arlyss Leita BRAVO, PA-C 02/13/24 1345
